# Patient Record
Sex: Female | Born: 1955 | Race: White | Hispanic: No | Marital: Married | Smoking: Former smoker
Health system: Southern US, Community
[De-identification: ages and names within clinical notes are randomized; demographics above are authoritative.]

## PROBLEM LIST (undated history)

## (undated) DIAGNOSIS — G43909 Migraine, unspecified, not intractable, without status migrainosus: Secondary | ICD-10-CM

## (undated) DIAGNOSIS — R011 Cardiac murmur, unspecified: Secondary | ICD-10-CM

## (undated) DIAGNOSIS — T7840XA Allergy, unspecified, initial encounter: Secondary | ICD-10-CM

## (undated) HISTORY — DX: Migraine, unspecified, not intractable, without status migrainosus: G43.909

## (undated) HISTORY — DX: Cardiac murmur, unspecified: R01.1

## (undated) HISTORY — PX: FOOT FRACTURE SURGERY: SHX645

## (undated) HISTORY — DX: Allergy, unspecified, initial encounter: T78.40XA

---

## 2013-11-03 HISTORY — PX: FACIAL COSMETIC SURGERY: SHX629

## 2015-08-17 ENCOUNTER — Encounter: Payer: Self-pay | Admitting: Internal Medicine

## 2015-08-17 ENCOUNTER — Ambulatory Visit (INDEPENDENT_AMBULATORY_CARE_PROVIDER_SITE_OTHER): Payer: Managed Care, Other (non HMO) | Admitting: Internal Medicine

## 2015-08-17 VITALS — BP 136/86 | HR 70 | Temp 98.2°F | Resp 14 | Ht 67.0 in | Wt 136.0 lb

## 2015-08-17 DIAGNOSIS — Z1211 Encounter for screening for malignant neoplasm of colon: Secondary | ICD-10-CM

## 2015-08-17 DIAGNOSIS — Z0000 Encounter for general adult medical examination without abnormal findings: Secondary | ICD-10-CM | POA: Diagnosis not present

## 2015-08-17 MED ORDER — NYSTATIN-TRIAMCINOLONE 100000-0.1 UNIT/GM-% EX CREA: 1.0000 "application " | TOPICAL_CREAM | Freq: Two times a day (BID) | CUTANEOUS | Status: DC

## 2015-08-17 MED ORDER — ESCITALOPRAM OXALATE 10 MG PO TABS: 10.0000 mg | ORAL_TABLET | Freq: Every day | ORAL | Status: DC

## 2015-08-17 NOTE — Progress Notes (Signed)
Pre visit review using our clinic review tool, if applicable. No additional management support is needed unless otherwise documented below in the visit note. 

## 2015-08-17 NOTE — Patient Instructions (Signed)
We have sent in lexapro (escitalopram) which is an atypical antidepressant to help with the hot flashes. It can take up to 2 weeks to take full effect with the hot flashes. If you do not notice benefit call the office and we can explore other options to get you feeling better.   We will check the labs today and call you back with the results. We will get you in with the GI doctor to do the colon cancer screening.   We have also sent in a cream for the rash that you can use twice a day until the rash is gone then you can use as needed if the rash returns.   Health Maintenance, Female Adopting a healthy lifestyle and getting preventive care can go a long way to promote health and wellness. Talk with your health care provider about what schedule of regular examinations is right for you. This is a good chance for you to check in with your provider about disease prevention and staying healthy. In between checkups, there are plenty of things you can do on your own. Experts have done a lot of research about which lifestyle changes and preventive measures are most likely to keep you healthy. Ask your health care provider for more information. WEIGHT AND DIET  Eat a healthy diet  Be sure to include plenty of vegetables, fruits, low-fat dairy products, and lean protein.  Do not eat a lot of foods high in solid fats, added sugars, or salt.  Get regular exercise. This is one of the most important things you can do for your health.  Most adults should exercise for at least 150 minutes each week. The exercise should increase your heart rate and make you sweat (moderate-intensity exercise).  Most adults should also do strengthening exercises at least twice a week. This is in addition to the moderate-intensity exercise.  Maintain a healthy weight  Body mass index (BMI) is a measurement that can be used to identify possible weight problems. It estimates body fat based on height and weight. Your health care  provider can help determine your BMI and help you achieve or maintain a healthy weight.  For females 47 years of age and older:   A BMI below 18.5 is considered underweight.  A BMI of 18.5 to 24.9 is normal.  A BMI of 25 to 29.9 is considered overweight.  A BMI of 30 and above is considered obese.  Watch levels of cholesterol and blood lipids  You should start having your blood tested for lipids and cholesterol at 58 years of age, then have this test every 5 years.  You may need to have your cholesterol levels checked more often if:  Your lipid or cholesterol levels are high.  You are older than 58 years of age.  You are at high risk for heart disease.  CANCER SCREENING   Lung Cancer  Lung cancer screening is recommended for adults 80-53 years old who are at high risk for lung cancer because of a history of smoking.  A yearly low-dose CT scan of the lungs is recommended for people who:  Currently smoke.  Have quit within the past 15 years.  Have at least a 30-pack-year history of smoking. A pack year is smoking an average of one pack of cigarettes a day for 1 year.  Yearly screening should continue until it has been 15 years since you quit.  Yearly screening should stop if you develop a health problem that would prevent you  from having lung cancer treatment.  Breast Cancer  Practice breast self-awareness. This means understanding how your breasts normally appear and feel.  It also means doing regular breast self-exams. Let your health care provider know about any changes, no matter how small.  If you are in your 20s or 30s, you should have a clinical breast exam (CBE) by a health care provider every 1-3 years as part of a regular health exam.  If you are 13 or older, have a CBE every year. Also consider having a breast X-ray (mammogram) every year.  If you have a family history of breast cancer, talk to your health care provider about genetic screening.  If you  are at high risk for breast cancer, talk to your health care provider about having an MRI and a mammogram every year.  Breast cancer gene (BRCA) assessment is recommended for women who have family members with BRCA-related cancers. BRCA-related cancers include:  Breast.  Ovarian.  Tubal.  Peritoneal cancers.  Results of the assessment will determine the need for genetic counseling and BRCA1 and BRCA2 testing. Cervical Cancer Your health care provider may recommend that you be screened regularly for cancer of the pelvic organs (ovaries, uterus, and vagina). This screening involves a pelvic examination, including checking for microscopic changes to the surface of your cervix (Pap test). You may be encouraged to have this screening done every 3 years, beginning at age 33.  For women ages 94-65, health care providers may recommend pelvic exams and Pap testing every 3 years, or they may recommend the Pap and pelvic exam, combined with testing for human papilloma virus (HPV), every 5 years. Some types of HPV increase your risk of cervical cancer. Testing for HPV may also be done on women of any age with unclear Pap test results.  Other health care providers may not recommend any screening for nonpregnant women who are considered low risk for pelvic cancer and who do not have symptoms. Ask your health care provider if a screening pelvic exam is right for you.  If you have had past treatment for cervical cancer or a condition that could lead to cancer, you need Pap tests and screening for cancer for at least 20 years after your treatment. If Pap tests have been discontinued, your risk factors (such as having a new sexual partner) need to be reassessed to determine if screening should resume. Some women have medical problems that increase the chance of getting cervical cancer. In these cases, your health care provider may recommend more frequent screening and Pap tests. Colorectal Cancer  This type of  cancer can be detected and often prevented.  Routine colorectal cancer screening usually begins at 58 years of age and continues through 58 years of age.  Your health care provider may recommend screening at an earlier age if you have risk factors for colon cancer.  Your health care provider may also recommend using home test kits to check for hidden blood in the stool.  A small camera at the end of a tube can be used to examine your colon directly (sigmoidoscopy or colonoscopy). This is done to check for the earliest forms of colorectal cancer.  Routine screening usually begins at age 17.  Direct examination of the colon should be repeated every 5-10 years through 58 years of age. However, you may need to be screened more often if early forms of precancerous polyps or small growths are found. Skin Cancer  Check your skin from head to toe  regularly.  Tell your health care provider about any new moles or changes in moles, especially if there is a change in a mole's shape or color.  Also tell your health care provider if you have a mole that is larger than the size of a pencil eraser.  Always use sunscreen. Apply sunscreen liberally and repeatedly throughout the day.  Protect yourself by wearing long sleeves, pants, a wide-brimmed hat, and sunglasses whenever you are outside. HEART DISEASE, DIABETES, AND HIGH BLOOD PRESSURE   High blood pressure causes heart disease and increases the risk of stroke. High blood pressure is more likely to develop in:  People who have blood pressure in the high end of the normal range (130-139/85-89 mm Hg).  People who are overweight or obese.  People who are African American.  If you are 104-42 years of age, have your blood pressure checked every 3-5 years. If you are 84 years of age or older, have your blood pressure checked every year. You should have your blood pressure measured twice--once when you are at a hospital or clinic, and once when you are  not at a hospital or clinic. Record the average of the two measurements. To check your blood pressure when you are not at a hospital or clinic, you can use:  An automated blood pressure machine at a pharmacy.  A home blood pressure monitor.  If you are between 82 years and 41 years old, ask your health care provider if you should take aspirin to prevent strokes.  Have regular diabetes screenings. This involves taking a blood sample to check your fasting blood sugar level.  If you are at a normal weight and have a low risk for diabetes, have this test once every three years after 58 years of age.  If you are overweight and have a high risk for diabetes, consider being tested at a younger age or more often. PREVENTING INFECTION  Hepatitis B  If you have a higher risk for hepatitis B, you should be screened for this virus. You are considered at high risk for hepatitis B if:  You were born in a country where hepatitis B is common. Ask your health care provider which countries are considered high risk.  Your parents were born in a high-risk country, and you have not been immunized against hepatitis B (hepatitis B vaccine).  You have HIV or AIDS.  You use needles to inject street drugs.  You live with someone who has hepatitis B.  You have had sex with someone who has hepatitis B.  You get hemodialysis treatment.  You take certain medicines for conditions, including cancer, organ transplantation, and autoimmune conditions. Hepatitis C  Blood testing is recommended for:  Everyone born from [birth year] through 1964.  Anyone with known risk factors for hepatitis C. Sexually transmitted infections (STIs)  You should be screened for sexually transmitted infections (STIs) including gonorrhea and chlamydia if:  You are sexually active and are younger than 58 years of age.  You are older than 58 years of age and your health care provider tells you that you are at risk for this type of  infection.  Your sexual activity has changed since you were last screened and you are at an increased risk for chlamydia or gonorrhea. Ask your health care provider if you are at risk.  If you do not have HIV, but are at risk, it may be recommended that you take a prescription medicine daily to prevent HIV infection. This is  called pre-exposure prophylaxis (PrEP). You are considered at risk if:  You are sexually active and do not regularly use condoms or know the HIV status of your partner(s).  You take drugs by injection.  You are sexually active with a partner who has HIV. Talk with your health care provider about whether you are at high risk of being infected with HIV. If you choose to begin PrEP, you should first be tested for HIV. You should then be tested every 3 months for as long as you are taking PrEP.  PREGNANCY   If you are premenopausal and you may become pregnant, ask your health care provider about preconception counseling.  If you may become pregnant, take 400 to 800 micrograms (mcg) of folic acid every day.  If you want to prevent pregnancy, talk to your health care provider about birth control (contraception). OSTEOPOROSIS AND MENOPAUSE   Osteoporosis is a disease in which the bones lose minerals and strength with aging. This can result in serious bone fractures. Your risk for osteoporosis can be identified using a bone density scan.  If you are 13 years of age or older, or if you are at risk for osteoporosis and fractures, ask your health care provider if you should be screened.  Ask your health care provider whether you should take a calcium or vitamin D supplement to lower your risk for osteoporosis.  Menopause may have certain physical symptoms and risks.  Hormone replacement therapy may reduce some of these symptoms and risks. Talk to your health care provider about whether hormone replacement therapy is right for you.  HOME CARE INSTRUCTIONS   Schedule regular  health, dental, and eye exams.  Stay current with your immunizations.   Do not use any tobacco products including cigarettes, chewing tobacco, or electronic cigarettes.  If you are pregnant, do not drink alcohol.  If you are breastfeeding, limit how much and how often you drink alcohol.  Limit alcohol intake to no more than 1 drink per day for nonpregnant women. One drink equals 12 ounces of beer, 5 ounces of wine, or 1 ounces of hard liquor.  Do not use street drugs.  Do not share needles.  Ask your health care provider for help if you need support or information about quitting drugs.  Tell your health care provider if you often feel depressed.  Tell your health care provider if you have ever been abused or do not feel safe at home.   This information is not intended to replace advice given to you by your health care provider. Make sure you discuss any questions you have with your health care provider.   Document Released: 05/05/2011 Document Revised: 11/10/2014 Document Reviewed: 09/21/2013 Elsevier Interactive Patient Education [employer].

## 2015-08-19 DIAGNOSIS — Z0000 Encounter for general adult medical examination without abnormal findings: Secondary | ICD-10-CM | POA: Insufficient documentation

## 2015-08-19 NOTE — Progress Notes (Signed)
   Subjective:    Patient ID: Shirley Wilson, female    DOB: 12/04/1955, 59 y.o.   MRN: [id number]  HPI The patient is a 59 YO female coming in new for wellness. She is having some mild hot flashes and would like to try non-hormonal therapy for that. No other concerns.   [facility], social history reviewed and updated.   Review of Systems  Constitutional: Negative for fever, activity change, appetite change, fatigue and unexpected weight change.  HENT: Negative.   Eyes: Negative.   Respiratory: Negative for cough, chest tightness, shortness of breath and wheezing.   Cardiovascular: Negative for chest pain, palpitations and leg swelling.  Gastrointestinal: Negative for nausea, abdominal pain, diarrhea, constipation and abdominal distention.  Musculoskeletal: Negative.   Skin: Negative.   Neurological: Negative.   Psychiatric/Behavioral: Negative.       Objective:   Physical Exam  Constitutional: She is oriented to person, place, and time. She appears well-developed and well-nourished.  HENT:  Head: Normocephalic and atraumatic.  Eyes: EOM are normal.  Neck: Normal range of motion.  Cardiovascular: Normal rate and regular rhythm.   Pulmonary/Chest: Effort normal and breath sounds normal. No respiratory distress. She has no wheezes. She has no rales.  Abdominal: Soft. Bowel sounds are normal. She exhibits no distension. There is no tenderness. There is no rebound.  Musculoskeletal: She exhibits no edema.  Neurological: She is alert and oriented to person, place, and time. Coordination normal.  Skin: Skin is warm and dry.  Psychiatric: She has a normal mood and affect.   Filed Vitals:   08/17/15 1548  BP: 136/86  Pulse: 70  Temp: 98.2 F (36.8 C)  Resp: 14  Height: 5\' 7"  (1.702 m)  Weight: 136 lb (61.689 kg)  SpO2: 99%      Assessment & Plan:

## 2015-08-19 NOTE — Assessment & Plan Note (Addendum)
Rx for lexapro for symptomatic hot flashes. Referral to GI for colonoscopy (has never had one). Checking labs, adjust as needed. Non-smoker and exercises regularly. Declines flu shot and will get records for other immunizations.

## 2015-08-20 ENCOUNTER — Encounter: Payer: Self-pay | Admitting: Gastroenterology

## 2015-08-21 ENCOUNTER — Ambulatory Visit (AMBULATORY_SURGERY_CENTER): Payer: Self-pay | Admitting: *Deleted

## 2015-08-21 VITALS — Ht 67.0 in | Wt 136.6 lb

## 2015-08-21 DIAGNOSIS — Z1211 Encounter for screening for malignant neoplasm of colon: Secondary | ICD-10-CM

## 2015-08-21 MED ORDER — NA SULFATE-K SULFATE-MG SULF 17.5-3.13-1.6 GM/177ML PO SOLN: ORAL | Status: DC

## 2015-08-21 NOTE — Progress Notes (Signed)
No egg or soy allergy  No anesthesia or intubation problems per pt  No diet medications taken   

## 2015-08-23 ENCOUNTER — Telehealth: Payer: Self-pay | Admitting: *Deleted

## 2015-08-23 MED ORDER — CITALOPRAM HYDROBROMIDE 20 MG PO TABS: 20.0000 mg | ORAL_TABLET | Freq: Every day | ORAL | Status: DC

## 2015-08-23 NOTE — Telephone Encounter (Signed)
Left msg on triage stating she started on the Lexapro last Friday. She had to stop med on yesterday can't tolerate the symptoms. Med make her nauseated, dizzy, and have headaches. Requesting something else...Raechel Chute/lmb

## 2015-08-23 NOTE — Telephone Encounter (Signed)
Notified pt with md response. Rx sent to pharmacy.../lmb 

## 2015-08-23 NOTE — Telephone Encounter (Signed)
Have sent in the alternative citalopram. Take 1 pill daily and should see results in 2-3 weeks.

## 2015-08-24 ENCOUNTER — Other Ambulatory Visit (INDEPENDENT_AMBULATORY_CARE_PROVIDER_SITE_OTHER): Payer: Managed Care, Other (non HMO)

## 2015-08-24 DIAGNOSIS — Z0000 Encounter for general adult medical examination without abnormal findings: Secondary | ICD-10-CM

## 2015-08-24 LAB — LIPID PANEL
CHOL/HDL RATIO: 3
Cholesterol: 277 mg/dL — ABNORMAL HIGH (ref 0–200)
HDL: 84.2 mg/dL (ref >39.00)
LDL CALC: 164 mg/dL — AB (ref 0–99)
NONHDL: 192.48
Triglycerides: 141 mg/dL (ref 0.0–149.0)
VLDL: 28.2 mg/dL (ref 0.0–40.0)

## 2015-08-24 LAB — COMPREHENSIVE METABOLIC PANEL
ALK PHOS: 72 U/L (ref 39–117)
ALT: 28 U/L (ref 0–35)
AST: 25 U/L (ref 0–37)
Albumin: 4.8 g/dL (ref 3.5–5.2)
BUN: 11 mg/dL (ref 6–23)
CHLORIDE: 101 meq/L (ref 96–112)
CO2: 28 meq/L (ref 19–32)
Calcium: 10.3 mg/dL (ref 8.4–10.5)
Creatinine, Ser: 0.73 mg/dL (ref 0.40–1.20)
GFR: 87.04 mL/min (ref >60.00)
GLUCOSE: 89 mg/dL (ref 70–99)
POTASSIUM: 4.1 meq/L (ref 3.5–5.1)
SODIUM: 138 meq/L (ref 135–145)
TOTAL PROTEIN: 7.6 g/dL (ref 6.0–8.3)
Total Bilirubin: 0.5 mg/dL (ref 0.2–1.2)

## 2015-08-24 LAB — CBC
HCT: 47.2 % — ABNORMAL HIGH (ref 36.0–46.0)
HEMOGLOBIN: 15.5 g/dL — AB (ref 12.0–15.0)
MCHC: 32.8 g/dL (ref 30.0–36.0)
MCV: 90.4 fl (ref 78.0–100.0)
Platelets: 314 10*3/uL (ref 150.0–400.0)
RBC: 5.23 Mil/uL — ABNORMAL HIGH (ref 3.87–5.11)
RDW: 13.1 % (ref 11.5–15.5)
WBC: 5.2 10*3/uL (ref 4.0–10.5)

## 2015-08-31 ENCOUNTER — Encounter: Payer: Self-pay | Admitting: Gastroenterology

## 2015-08-31 ENCOUNTER — Ambulatory Visit (AMBULATORY_SURGERY_CENTER): Payer: Managed Care, Other (non HMO) | Admitting: Gastroenterology

## 2015-08-31 VITALS — BP 122/77 | HR 57 | Temp 98.1°F | Resp 20 | Ht 67.0 in | Wt 136.6 lb

## 2015-08-31 DIAGNOSIS — Z1211 Encounter for screening for malignant neoplasm of colon: Secondary | ICD-10-CM

## 2015-08-31 MED ORDER — SODIUM CHLORIDE 0.9 % IV SOLN: 500.0000 mL | INTRAVENOUS | Status: DC

## 2015-08-31 NOTE — Progress Notes (Signed)
Report to PACU, RN, vss, BBS= Clear.  

## 2015-08-31 NOTE — Op Note (Signed)
[facility] [address] [place], [id number]   COLONOSCOPY PROCEDURE REPORT  PATIENT: Shirley RogerLeveque, Camille  MR#: [id number] BIRTHDATE: 09-Aug-1956 , 58  yrs. old GENDER: female ENDOSCOPIST: Rachael Feeaniel P Jacobs, MD REFERRED BY: Hillard DankerElizabeth Crawford, MD PROCEDURE DATE:  08/31/2015 PROCEDURE:   Colonoscopy, screening First Screening Colonoscopy - Avg.  risk and is 50 yrs.  old or older Yes.  Prior Negative Screening - Now for repeat screening. N/A  History of Adenoma - Now for follow-up colonoscopy & has been > or = to 3 yrs.  N/A  Recommend repeat exam, <10 yrs? No ASA CLASS:   Class II INDICATIONS:Screening for colonic neoplasia and Colorectal Neoplasm Risk Assessment for this procedure is average risk. MEDICATIONS: Monitored anesthesia care and Propofol 230 mg IV  DESCRIPTION OF PROCEDURE:   After the risks benefits and alternatives of the procedure were thoroughly explained, informed consent was obtained.  The digital rectal exam revealed no abnormalities of the rectum.   The LB [id number]-[id number]  endoscope was introduced through the anus and advanced to the cecum, which was identified by both the appendix and ileocecal valve. No adverse events experienced.   The quality of the prep was excellent.  The instrument was then slowly withdrawn as the colon was fully examined. Estimated blood loss is zero unless otherwise noted in this procedure report.   COLON FINDINGS: There was very minor 'barotrauma' in the cecum, proximal ascending segment.  The examination was otherwise normal. Retroflexed views revealed no abnormalities. The time to cecum = 4.2 Withdrawal time = 5.7   The scope was withdrawn and the procedure completed. COMPLICATIONS: There were no immediate complications.  ENDOSCOPIC IMPRESSION: There was very minor 'barotrauma' in the cecum, proximal ascending segment.  The examination was otherwise normal  RECOMMENDATIONS: You should continue to follow colorectal  cancer screening guidelines for "routine risk" patients with a repeat colonoscopy in 10 years.   eSigned:  Rachael Feeaniel P Jacobs, MD 08/31/2015 9:26 AM

## 2015-08-31 NOTE — Patient Instructions (Signed)
YOU HAD AN ENDOSCOPIC PROCEDURE TODAY AT THE [facility]:   Refer to the procedure report that was given to you for any specific questions about what was found during the examination.  If the procedure report does not answer your questions, please call your gastroenterologist to clarify.  If you requested that your care partner not be given the details of your procedure findings, then the procedure report has been included in a sealed envelope for you to review at your convenience later.  YOU SHOULD EXPECT: Some feelings of bloating in the abdomen. Passage of more gas than usual.  Walking can help get rid of the air that was put into your GI tract during the procedure and reduce the bloating. If you had a lower endoscopy (such as a colonoscopy or flexible sigmoidoscopy) you may notice spotting of blood in your stool or on the toilet paper. If you underwent a bowel prep for your procedure, you may not have a normal bowel movement for a few days.  Please Note:  You might notice some irritation and congestion in your nose or some drainage.  This is from the oxygen used during your procedure.  There is no need for concern and it should clear up in a day or so.  SYMPTOMS TO REPORT IMMEDIATELY:   Following lower endoscopy (colonoscopy or flexible sigmoidoscopy):  Excessive amounts of blood in the stool  Significant tenderness or worsening of abdominal pains  Swelling of the abdomen that is new, acute  Fever of 100F or higher   For urgent or emergent issues, a gastroenterologist can be reached at any hour by calling [phone].   DIET: Your first meal following the procedure should be a small meal and then it is ok to progress to your normal diet. Heavy or fried foods are harder to digest and may make you feel nauseous or bloated.  Likewise, meals heavy in dairy and vegetables can increase bloating.  Drink plenty of fluids but you should avoid alcoholic beverages for 24  hours.  ACTIVITY:  You should plan to take it easy for the rest of today and you should NOT DRIVE or use heavy machinery until tomorrow (because of the sedation medicines used during the test).    FOLLOW UP: Our staff will call the number listed on your records the next business day following your procedure to check on you and address any questions or concerns that you may have regarding the information given to you following your procedure. If we do not reach you, we will leave a message.  However, if you are feeling well and you are not experiencing any problems, there is no need to return our call.  We will assume that you have returned to your regular daily activities without incident.  If any biopsies were taken you will be contacted by phone or by letter within the next 1-3 weeks.  Please call us at [phone] if you have not heard about the biopsies in 3 weeks.    SIGNATURES/CONFIDENTIALITY: You and/or your care partner have signed paperwork which will be entered into your electronic medical record.  These signatures attest to the fact that that the information above on your After Visit Summary has been reviewed and is understood.  Full responsibility of the confidentiality of this discharge information lies with you and/or your care-partner. 

## 2015-09-03 ENCOUNTER — Telehealth: Payer: Self-pay | Admitting: *Deleted

## 2015-09-03 NOTE — Telephone Encounter (Signed)
Left msg on triage stating wanting to see will md rx something for motion sickness prefer phenergan. [place]...Raechel Chute/lmb

## 2015-09-03 NOTE — Telephone Encounter (Signed)
  Follow up Call-  Call back number 08/31/2015  Post procedure Call Back phone  # [phone]  Permission to leave phone message Yes     Patient questions:  Message left to call us if necessary.

## 2015-09-03 NOTE — Telephone Encounter (Signed)
Both benadryl and meclizine over the counter are good agents for motion sickness. These tend to be better as they are less sedating than phenergan.

## 2015-09-03 NOTE — Telephone Encounter (Signed)
Called pt no answer LMOM with md response.../lmb 

## 2015-09-06 ENCOUNTER — Ambulatory Visit (INDEPENDENT_AMBULATORY_CARE_PROVIDER_SITE_OTHER): Payer: Managed Care, Other (non HMO) | Admitting: Family

## 2015-09-06 ENCOUNTER — Encounter: Payer: Self-pay | Admitting: Family

## 2015-09-06 VITALS — BP 132/84 | HR 55 | Temp 98.0°F | Resp 18 | Ht 67.0 in | Wt 137.0 lb

## 2015-09-06 DIAGNOSIS — T753XXD Motion sickness, subsequent encounter: Secondary | ICD-10-CM | POA: Diagnosis not present

## 2015-09-06 DIAGNOSIS — J0100 Acute maxillary sinusitis, unspecified: Secondary | ICD-10-CM

## 2015-09-06 DIAGNOSIS — T753XXA Motion sickness, initial encounter: Secondary | ICD-10-CM | POA: Insufficient documentation

## 2015-09-06 DIAGNOSIS — J019 Acute sinusitis, unspecified: Secondary | ICD-10-CM | POA: Insufficient documentation

## 2015-09-06 MED ORDER — PROMETHAZINE HCL 12.5 MG PO TABS: 12.5000 mg | ORAL_TABLET | Freq: Three times a day (TID) | ORAL | Status: DC | PRN

## 2015-09-06 MED ORDER — PREDNISONE 20 MG PO TABS: 20.0000 mg | ORAL_TABLET | Freq: Two times a day (BID) | ORAL | Status: DC

## 2015-09-06 MED ORDER — AMOXICILLIN-POT CLAVULANATE 875-125 MG PO TABS: 1.0000 | ORAL_TABLET | Freq: Two times a day (BID) | ORAL | Status: DC

## 2015-09-06 NOTE — Progress Notes (Signed)
Subjective:    Patient ID: Shirley Wilson, female    DOB: [birth year], 59 y.o.   MRN: [id number]  Chief Complaint  Patient presents with  . Sinus pressure    x2 days, runny, nose, sinus headache and pressure    HPI:  Shirley Wilson is a 59 y.o. female who  has a past medical history of Allergy; Migraine; and Heart murmur. and presents today for an acute office visit.  1.) Sinus pressure - This is a new problem. Associated symptoms of congestion, sinus headache, and sinus pressure been going on for approximately 3 days. Modifying factors include an OTC allergy/decongestant and afrin which have not helped with her symptoms. Symptoms have improved a little but the amount of discomfort is effecting her lifestyle. Severity is enough to keep her up at night. Denies any recent antibiotic use.   2.) Motion sickness - previously noted to have motion sickness when riding cars and is planning a trip to the mountains. Indicates scopolamine has helped for seasickness benign cars. Requesting a refill of Phenergan which she had taken previously which helps with her nausea during car rides.  Allergies  Allergen Reactions  . Sulfa Antibiotics     Rash all over     Current Outpatient Prescriptions on File Prior to Visit  Medication Sig Dispense Refill  . citalopram (CELEXA) 20 MG tablet Take 1 tablet (20 mg total) by mouth daily. 30 tablet 3   No current facility-administered medications on file prior to visit.     Past Surgical History  Procedure Laterality Date  . Foot fracture surgery Right   . Facial cosmetic surgery  2015      Review of Systems  Constitutional: Negative for fever and chills.  HENT: Positive for congestion, ear pain and sinus pressure. Negative for sore throat.   Respiratory: Positive for cough. Negative for chest tightness and shortness of breath.   Neurological: Positive for headaches.      Objective:    BP 132/84 mmHg  Pulse 55  Temp(Src) 98 F (36.7 C) (Oral)   Resp 18  Ht  (1.702 m)  Wt 137 lb (62.143 kg)  BMI 21.45 kg/m2  SpO2 98%  LMP  (LMP Unknown) Nursing note and vital signs reviewed.  Physical Exam  Constitutional: She is oriented to person, place, and time. She appears well-developed and well-nourished. No distress.  HENT:  Right Ear: Hearing, tympanic membrane, external ear and ear canal normal.  Left Ear: Hearing, tympanic membrane, external ear and ear canal normal.  Nose: Right sinus exhibits maxillary sinus tenderness and frontal sinus tenderness. Left sinus exhibits maxillary sinus tenderness and frontal sinus tenderness.  Mouth/Throat: Uvula is midline, oropharynx is clear and moist and mucous membranes are normal.  Neck: Neck supple.  Cardiovascular: Normal rate, regular rhythm, normal heart sounds and intact distal pulses.   Pulmonary/Chest: Effort normal and breath sounds normal.  Neurological: She is alert and oriented to person, place, and time.  Skin: Skin is warm and dry.  Psychiatric: She has a normal mood and affect. Her behavior is normal. Judgment and thought content normal.       Assessment & Plan:   Problem List Items Addressed This Visit      Respiratory   Sinusitis, acute - Primary    Symptoms and exam consistent with sinusitis. Start Augmentin. Start prednisone for allergy control. Continue over-the-counter medications as needed for symptom relief and supportive care. Follow-up if symptoms worsen or fail to improve.  Relevant Medications   amoxicillin-clavulanate (AUGMENTIN) 875-125 MG tablet   predniSONE (DELTASONE) 20 MG tablet   promethazine (PHENERGAN) 12.5 MG tablet     Other   Motion sickness    History of motion sickness improved with Phenergan. Refill Phenergan per patient request. Follow-up if symptoms worsen or do not improve with medication regimen.      Relevant Medications   promethazine (PHENERGAN) 12.5 MG tablet

## 2015-09-06 NOTE — Assessment & Plan Note (Signed)
History of motion sickness improved with Phenergan. Refill Phenergan per patient request. Follow-up if symptoms worsen or do not improve with medication regimen.

## 2015-09-06 NOTE — Assessment & Plan Note (Signed)
Symptoms and exam consistent with sinusitis. Start Augmentin. Start prednisone for allergy control. Continue over-the-counter medications as needed for symptom relief and supportive care. Follow-up if symptoms worsen or fail to improve.

## 2015-09-06 NOTE — Patient Instructions (Signed)
Thank you for choosing [facility].  Summary/Instructions:  Your prescription(s) have been submitted to your pharmacy or been printed and provided for you. Please take as directed and contact our office if you believe you are having problem(s) with the medication(s) or have any questions.  If your symptoms worsen or fail to improve, please contact our office for further instruction, or in case of emergency go directly to the emergency room at the closest medical facility.   General Recommendations:    Please drink plenty of fluids.  Get plenty of rest   Sleep in humidified air  Use saline nasal sprays  Netti pot   OTC Medications:  Decongestants - helps relieve congestion   Flonase (generic fluticasone) or Nasacort (generic triamcinolone) - please make sure to use the "cross-over" technique at a 45 degree angle towards the opposite eye as opposed to straight up the nasal passageway.   Sudafed (generic pseudoephedrine - Note this is the one that is available behind the pharmacy counter); Products with phenylephrine (-PE) may also be used but is often not as effective as pseudoephedrine.   If you have HIGH BLOOD PRESSURE - Coricidin HBP; AVOID any product that is -D as this contains pseudoephedrine which may increase your blood pressure.  Afrin (oxymetazoline) every 6-8 hours for up to 3 days.   Allergies - helps relieve runny nose, itchy eyes and sneezing   Claritin (generic loratidine), Allegra (fexofenidine), or Zyrtec (generic cyrterizine) for runny nose. These medications should not cause drowsiness.  Note - Benadryl (generic diphenhydramine) may be used however may cause drowsiness  Cough -   Delsym or Robitussin (generic dextromethorphan)  Expectorants - helps loosen mucus to ease removal   Mucinex (generic guaifenesin) as directed on the package.  Headaches / General Aches   Tylenol (generic acetaminophen) - DO NOT EXCEED 3 grams (3,000 mg) in a 24  hour time period  Advil/Motrin (generic ibuprofen)   Sore Throat -   Salt water gargle   Chloraseptic (generic benzocaine) spray or lozenges / Sucrets (generic dyclonine)    Sinusitis, Adult Sinusitis is redness, soreness, and inflammation of the paranasal sinuses. Paranasal sinuses are air pockets within the bones of your face. They are located beneath your eyes, in the middle of your forehead, and above your eyes. In healthy paranasal sinuses, mucus is able to drain out, and air is able to circulate through them by way of your nose. However, when your paranasal sinuses are inflamed, mucus and air can become trapped. This can allow bacteria and other germs to grow and cause infection. Sinusitis can develop quickly and last only a short time (acute) or continue over a long period (chronic). Sinusitis that lasts for more than 12 weeks is considered chronic. CAUSES Causes of sinusitis include:  Allergies.  Structural abnormalities, such as displacement of the cartilage that separates your nostrils (deviated septum), which can decrease the air flow through your nose and sinuses and affect sinus drainage.  Functional abnormalities, such as when the small hairs (cilia) that line your sinuses and help remove mucus do not work properly or are not present. SIGNS AND SYMPTOMS Symptoms of acute and chronic sinusitis are the same. The primary symptoms are pain and pressure around the affected sinuses. Other symptoms include:  Upper toothache.  Earache.  Headache.  Bad breath.  Decreased sense of smell and taste.  A cough, which worsens when you are lying flat.  Fatigue.  Fever.  Thick drainage from your nose, which often is green   and may contain pus (purulent).  Swelling and warmth over the affected sinuses. DIAGNOSIS Your health care provider will perform a physical exam. During your exam, your health care provider may perform any of the following to help determine if you have  acute sinusitis or chronic sinusitis:  Look in your nose for signs of abnormal growths in your nostrils (nasal polyps).  Tap over the affected sinus to check for signs of infection.  View the inside of your sinuses using an imaging device that has a light attached (endoscope). If your health care provider suspects that you have chronic sinusitis, one or more of the following tests may be recommended:  Allergy tests.  Nasal culture. A sample of mucus is taken from your nose, sent to a lab, and screened for bacteria.  Nasal cytology. A sample of mucus is taken from your nose and examined by your health care provider to determine if your sinusitis is related to an allergy. TREATMENT Most cases of acute sinusitis are related to a viral infection and will resolve on their own within 10 days. Sometimes, medicines are prescribed to help relieve symptoms of both acute and chronic sinusitis. These may include pain medicines, decongestants, nasal steroid sprays, or saline sprays. However, for sinusitis related to a bacterial infection, your health care provider will prescribe antibiotic medicines. These are medicines that will help kill the bacteria causing the infection. Rarely, sinusitis is caused by a fungal infection. In these cases, your health care provider will prescribe antifungal medicine. For some cases of chronic sinusitis, surgery is needed. Generally, these are cases in which sinusitis recurs more than 3 times per year, despite other treatments. HOME CARE INSTRUCTIONS  Drink plenty of water. Water helps thin the mucus so your sinuses can drain more easily.  Use a humidifier.  Inhale steam 3-4 times a day (for example, sit in the bathroom with the shower running).  Apply a warm, moist washcloth to your face 3-4 times a day, or as directed by your health care provider.  Use saline nasal sprays to help moisten and clean your sinuses.  Take medicines only as directed by your health care  provider.  If you were prescribed either an antibiotic or antifungal medicine, finish it all even if you start to feel better. SEEK IMMEDIATE MEDICAL CARE IF:  You have increasing pain or severe headaches.  You have nausea, vomiting, or drowsiness.  You have swelling around your face.  You have vision problems.  You have a stiff neck.  You have difficulty breathing.   This information is not intended to replace advice given to you by your health care provider. Make sure you discuss any questions you have with your health care provider.   Document Released: 10/20/2005 Document Revised: 11/10/2014 Document Reviewed: 11/04/2011 Elsevier Interactive Patient Education 2016 [employer]  

## 2015-09-06 NOTE — Progress Notes (Signed)
Pre visit review using our clinic review tool, if applicable. No additional management support is needed unless otherwise documented below in the visit note. 

## 2016-01-14 ENCOUNTER — Ambulatory Visit (INDEPENDENT_AMBULATORY_CARE_PROVIDER_SITE_OTHER): Payer: Managed Care, Other (non HMO) | Admitting: Internal Medicine

## 2016-01-14 ENCOUNTER — Other Ambulatory Visit: Payer: Self-pay | Admitting: Internal Medicine

## 2016-01-14 ENCOUNTER — Encounter: Payer: Self-pay | Admitting: Internal Medicine

## 2016-01-14 VITALS — BP 136/78 | HR 68 | Temp 98.4°F | Resp 12 | Ht 67.0 in | Wt 141.1 lb

## 2016-01-14 DIAGNOSIS — R232 Flushing: Secondary | ICD-10-CM

## 2016-01-14 DIAGNOSIS — Z1239 Encounter for other screening for malignant neoplasm of breast: Secondary | ICD-10-CM

## 2016-01-14 DIAGNOSIS — Z1231 Encounter for screening mammogram for malignant neoplasm of breast: Secondary | ICD-10-CM

## 2016-01-14 DIAGNOSIS — N951 Menopausal and female climacteric states: Secondary | ICD-10-CM

## 2016-01-14 MED ORDER — ESTRADIOL 0.5 MG PO TABS: 0.5000 mg | ORAL_TABLET | Freq: Every day | ORAL | Status: AC

## 2016-01-14 MED ORDER — PROGESTERONE MICRONIZED 200 MG PO CAPS: ORAL_CAPSULE | ORAL | Status: AC

## 2016-01-14 NOTE — Progress Notes (Signed)
Pre visit review using our clinic review tool, if applicable. No additional management support is needed unless otherwise documented below in the visit note. 

## 2016-01-14 NOTE — Progress Notes (Signed)
   Subjective:    Patient ID: Shirley Wilson, female    DOB: 02/12/1956, 60 y.o.   MRN: [id number]  HPI The patient is a 60 YO female coming in for some medication concerns. We had started lexapro for hot flashes but she had severe side effects so switched to celexa. She tried this for 2 months but was very sluggish. Hot flashes were somewhat better though. Has been on hormone replacement in the past. Tolerated that well. Has had colonoscopy since last visit and is glad that she did this for her health.   Review of Systems  Constitutional: Negative for fever, activity change, appetite change, fatigue and unexpected weight change.  Respiratory: Negative for cough, chest tightness, shortness of breath and wheezing.   Cardiovascular: Negative for chest pain, palpitations and leg swelling.  Gastrointestinal: Negative for nausea, abdominal pain, diarrhea, constipation and abdominal distention.  Endocrine: Positive for heat intolerance.  Musculoskeletal: Negative.   Skin: Negative.   Neurological: Negative.   Psychiatric/Behavioral: Negative.       Objective:   Physical Exam  Constitutional: She is oriented to person, place, and time. She appears well-developed and well-nourished.  HENT:  Head: Normocephalic and atraumatic.  Eyes: EOM are normal.  Neck: Normal range of motion.  Cardiovascular: Normal rate and regular rhythm.   Pulmonary/Chest: Effort normal and breath sounds normal. No respiratory distress. She has no wheezes. She has no rales.  Abdominal: Soft. Bowel sounds are normal. She exhibits no distension. There is no tenderness. There is no rebound.  Musculoskeletal: She exhibits no edema.  Neurological: She is alert and oriented to person, place, and time. Coordination normal.  Skin: Skin is warm and dry.  Psychiatric: She has a normal mood and affect.   Filed Vitals:   01/14/16 1519  BP: 136/78  Pulse: 68  Temp: 98.4 F (36.9 C)  TempSrc: Oral  Resp: 12  Height: 5\' 7"   (1.702 m)  Weight: 141 lb 1.9 oz (64.012 kg)  SpO2: 98%      Assessment & Plan:

## 2016-01-14 NOTE — Patient Instructions (Signed)
We have sent in the estrogen that you take daily. Since you still have your uterus you still need to be on progesterone as well for 12 days per month and we have sent that in as well.   We have ordered the mammogram and you can call and schedule at [phone]  Think about going to [facility] and they have pictures on the website of their providers.

## 2016-01-15 DIAGNOSIS — R232 Flushing: Secondary | ICD-10-CM | POA: Insufficient documentation

## 2016-01-15 NOTE — Assessment & Plan Note (Signed)
Rx for estrogen and progesterone (since she still has uterus) to help her get symptom free. She is also encouraged to get a gynecologist for her pap smears and she will do so.

## 2016-01-24 ENCOUNTER — Ambulatory Visit
Admission: RE | Admit: 2016-01-24 | Discharge: 2016-01-24 | Disposition: A | Discharge status: Home / Self Care | Payer: Managed Care, Other (non HMO) | Source: Ambulatory Visit | Attending: Internal Medicine | Admitting: Internal Medicine

## 2016-01-24 DIAGNOSIS — Z1231 Encounter for screening mammogram for malignant neoplasm of breast: Secondary | ICD-10-CM

## 2016-02-01 ENCOUNTER — Encounter: Payer: Self-pay | Admitting: Internal Medicine

## 2016-02-28 ENCOUNTER — Telehealth: Payer: Self-pay

## 2016-02-29 ENCOUNTER — Ambulatory Visit (INDEPENDENT_AMBULATORY_CARE_PROVIDER_SITE_OTHER)
Admission: RE | Admit: 2016-02-29 | Discharge: 2016-02-29 | Disposition: A | Discharge status: Home / Self Care | Payer: Managed Care, Other (non HMO) | Source: Ambulatory Visit | Attending: Internal Medicine | Admitting: Internal Medicine

## 2016-02-29 ENCOUNTER — Ambulatory Visit (INDEPENDENT_AMBULATORY_CARE_PROVIDER_SITE_OTHER): Payer: Managed Care, Other (non HMO) | Admitting: Internal Medicine

## 2016-02-29 ENCOUNTER — Encounter: Payer: Self-pay | Admitting: Internal Medicine

## 2016-02-29 VITALS — BP 130/70 | HR 67 | Temp 98.4°F | Wt 137.0 lb

## 2016-02-29 DIAGNOSIS — J189 Pneumonia, unspecified organism: Secondary | ICD-10-CM

## 2016-02-29 DIAGNOSIS — J209 Acute bronchitis, unspecified: Secondary | ICD-10-CM | POA: Insufficient documentation

## 2016-02-29 DIAGNOSIS — J4521 Mild intermittent asthma with (acute) exacerbation: Secondary | ICD-10-CM

## 2016-02-29 DIAGNOSIS — J00 Acute nasopharyngitis [common cold]: Secondary | ICD-10-CM | POA: Diagnosis not present

## 2016-02-29 DIAGNOSIS — J181 Lobar pneumonia, unspecified organism: Secondary | ICD-10-CM

## 2016-02-29 DIAGNOSIS — J45909 Unspecified asthma, uncomplicated: Secondary | ICD-10-CM | POA: Insufficient documentation

## 2016-02-29 MED ORDER — CEFDINIR 300 MG PO CAPS: 300.0000 mg | ORAL_CAPSULE | Freq: Two times a day (BID) | ORAL | Status: DC

## 2016-02-29 MED ORDER — METHYLPREDNISOLONE ACETATE 80 MG/ML IJ SUSP
80.0000 mg | Freq: Once | INTRAMUSCULAR | Status: AC
Start: 2016-02-29 — End: 2016-02-29
  Administered 2016-02-29: 80 mg via INTRAMUSCULAR

## 2016-02-29 MED ORDER — PROMETHAZINE-CODEINE 6.25-10 MG/5ML PO SYRP: 5.0000 mL | ORAL_SOLUTION | ORAL | Status: DC | PRN

## 2016-02-29 MED ORDER — UMECLIDINIUM-VILANTEROL 62.5-25 MCG/INH IN AEPB: 1.0000 | INHALATION_SPRAY | Freq: Every day | RESPIRATORY_TRACT | Status: DC

## 2016-02-29 MED ORDER — PSEUDOEPHEDRINE HCL ER 120 MG PO TB12: 120.0000 mg | ORAL_TABLET | Freq: Two times a day (BID) | ORAL | Status: DC | PRN

## 2016-02-29 NOTE — Patient Instructions (Signed)
Use over-the-counter  "cold" medicines  such as "Tylenol cold" , "Advil cold",  "Mucinex" or" Mucinex D"  for cough and congestion.   Avoid decongestants if you have high blood pressure and use "Afrin" nasal spray for nasal congestion as directed instead. Use" Delsym" or" Robitussin" cough syrup varietis for cough.  You can use plain "Tylenol" or "Advil" for fever, chills and achyness. Use Halls or Ricola cough drops.    

## 2016-02-29 NOTE — Progress Notes (Signed)
Subjective:  Patient ID: Shirley Wilson, female    DOB: 08/10/1956  Age: 60 y.o. MRN: [id number]  CC: No chief complaint on file.   HPI Shirley Wilson presents for URI since 4/8 - went to UC. UC on 4/18 - Prednisone, Levaquin, Albuterol C/o yellow phlegm - a lot of it .Marland Kitchen.Marland Kitchen.Can't sleep.Marland Kitchen.Marland Kitchen.Pt quit smoking 10 years ago  Outpatient Prescriptions Prior to Visit  Medication Sig Dispense Refill  . citalopram (CELEXA) 20 MG tablet Take 1 tablet (20 mg total) by mouth daily. 30 tablet 3  . estradiol (ESTRACE) 0.5 MG tablet Take 1 tablet (0.5 mg total) by mouth daily. 30 tablet 11  . progesterone (PROMETRIUM) 200 MG capsule Take 12 days per month 12 capsule 11   No facility-administered medications prior to visit.    ROS Review of Systems  Constitutional: Positive for fatigue. Negative for chills, activity change, appetite change and unexpected weight change.  HENT: Negative for congestion, mouth sores and sinus pressure.   Eyes: Negative for visual disturbance.  Respiratory: Positive for cough, choking and chest tightness.   Cardiovascular: Negative for palpitations and leg swelling.  Gastrointestinal: Negative for nausea and abdominal pain.  Genitourinary: Negative for frequency, difficulty urinating and vaginal pain.  Musculoskeletal: Negative for back pain, arthralgias and gait problem.  Skin: Negative for pallor and rash.  Neurological: Negative for dizziness, tremors, weakness, numbness and headaches.  Psychiatric/Behavioral: Positive for sleep disturbance. Negative for suicidal ideas and confusion.    Objective:  BP 130/70 mmHg  Pulse 67  Temp(Src) 98.4 F (36.9 C) (Oral)  Wt 137 lb (62.143 kg)  SpO2 96%  LMP  (LMP Unknown)  BP Readings from Last 3 Encounters:  02/29/16 130/70  01/14/16 136/78  09/06/15 132/84    Wt Readings from Last 3 Encounters:  02/29/16 137 lb (62.143 kg)  01/14/16 141 lb 1.9 oz (64.012 kg)  09/06/15 137 lb (62.143 kg)    Physical Exam    Constitutional: She appears well-developed. No distress.  HENT:  Head: Normocephalic.  Right Ear: External ear normal.  Left Ear: External ear normal.  Nose: Nose normal.  Mouth/Throat: Oropharynx is clear and moist.  Eyes: Conjunctivae are normal. Pupils are equal, round, and reactive to light. Right eye exhibits no discharge. Left eye exhibits no discharge.  Neck: Normal range of motion. Neck supple. No JVD present. No tracheal deviation present. No thyromegaly present.  Cardiovascular: Normal rate, regular rhythm and normal heart sounds.   Pulmonary/Chest: No stridor. No respiratory distress. She has no wheezes.  Abdominal: Soft. Bowel sounds are normal. She exhibits no distension and no mass. There is no tenderness. There is no rebound and no guarding.  Musculoskeletal: She exhibits no edema or tenderness.  Lymphadenopathy:    She has no cervical adenopathy.  Neurological: She displays normal reflexes. No cranial nerve deficit. She exhibits normal muscle tone. Coordination normal.  Skin: No rash noted. No erythema.  Psychiatric: She has a normal mood and affect. Her behavior is normal. Judgment and thought content normal.  Looks tired  I personally provided Anoro inhaler use teaching. After the teaching patient was able to demonstrate it's use effectively. All questions were answered   Lab Results  Component Value Date   WBC 5.2 08/24/2015   HGB 15.5* 08/24/2015   HCT 47.2* 08/24/2015   PLT 314.0 08/24/2015   GLUCOSE 89 08/24/2015   CHOL 277* 08/24/2015   TRIG 141.0 08/24/2015   HDL 84.20 08/24/2015   LDLCALC 164* 08/24/2015   ALT 28 08/24/2015  AST 25 08/24/2015   NA 138 08/24/2015   K 4.1 08/24/2015   CL 101 08/24/2015   CREATININE 0.73 08/24/2015   BUN 11 08/24/2015   CO2 28 08/24/2015    Mm Digital Screening Bilateral  01/24/2016  CLINICAL DATA:  Screening. EXAM: DIGITAL SCREENING BILATERAL MAMMOGRAM WITH CAD COMPARISON:  None. ACR Breast Density Category d:  The breast tissue is extremely dense, which lowers the sensitivity of mammography. FINDINGS: There are no findings suspicious for malignancy. Images were processed with CAD. IMPRESSION: No mammographic evidence of malignancy. A result letter of this screening mammogram will be mailed directly to the patient. RECOMMENDATION: Screening mammogram in one year. (Code:SM-B-01Y) BI-RADS CATEGORY  1: Negative. Electronically Signed   By: Amie Portland M.D.   On: 01/24/2016 16:58    Assessment & Plan:   There are no diagnoses linked to this encounter. I am having Ms. Mula maintain her citalopram, estradiol, and progesterone.  No orders of the defined types were placed in this encounter.     Follow-up: No Follow-up on file.  Sonda Primes, MD

## 2016-02-29 NOTE — Addendum Note (Signed)
Addended by: Tresa GarterPLOTNIKOV, Ronie Fleeger V on: 02/29/2016 05:22 PM   Modules accepted: Orders

## 2016-02-29 NOTE — Progress Notes (Signed)
Pre visit review using our clinic review tool, if applicable. No additional management support is needed unless otherwise documented below in the visit note. 

## 2016-02-29 NOTE — Telephone Encounter (Signed)
error 

## 2016-02-29 NOTE — Assessment & Plan Note (Addendum)
4/17 pt has finished Levaquin CXR

## 2016-02-29 NOTE — Assessment & Plan Note (Signed)
CXR in 3-4 wks - f/u w/Dr Okey Duprerawford Cefdinir x10 d

## 2016-02-29 NOTE — Assessment & Plan Note (Signed)
CXR Prom-cod Sudafed Anoro qd Depomedrol im 80 mg

## 2016-03-11 ENCOUNTER — Encounter: Payer: Self-pay | Admitting: Family Medicine

## 2016-03-11 ENCOUNTER — Ambulatory Visit (INDEPENDENT_AMBULATORY_CARE_PROVIDER_SITE_OTHER): Payer: Managed Care, Other (non HMO) | Admitting: Family Medicine

## 2016-03-11 VITALS — BP 118/78 | HR 67 | Temp 98.2°F | Resp 12 | Ht 67.0 in | Wt 137.0 lb

## 2016-03-11 DIAGNOSIS — R5383 Other fatigue: Secondary | ICD-10-CM

## 2016-03-11 DIAGNOSIS — J029 Acute pharyngitis, unspecified: Secondary | ICD-10-CM

## 2016-03-11 DIAGNOSIS — J181 Lobar pneumonia, unspecified organism: Secondary | ICD-10-CM | POA: Diagnosis not present

## 2016-03-11 NOTE — Patient Instructions (Addendum)
A few things to remember from today's visit:   1. Other fatigue Residual from recent pneumonia. For now monitor if it becomes chronic , > 6 months, we may need further work-up.  - CBC with Differential  2. Lobar pneumonia due to unspecified organism Improving. Chest X ray in 2-3 weeks, before if worsening symptoms.   - CBC with Differential - DG Chest 2 View; Future  3. Acute pharyngitis, unspecified etiology Most likely viral, symptomatic treatment recommended.       If you sign-up for My chart, you can communicate easier with us in case you have any question or concern.

## 2016-03-11 NOTE — Progress Notes (Signed)
Pre visit review using our clinic review tool, if applicable. No additional management support is needed unless otherwise documented below in the visit note. 

## 2016-03-11 NOTE — Progress Notes (Signed)
Subjective:    Patient ID: Shirley Wilson, female    DOB: 02/27/1956, 60 y.o.   MRN: [id number]  HPI   [name] is a 60 y.o.female here today complaining of about 30 days of respiratory symptoms and fatigue.  According to patient she started with respiratory symptoms on April 06/2016, she presented to acute care and diagnosed with flu-like symptoms, recommended symptomatic treatment.  Because no improvement she went back to acute care on April 16/2017, she was then diagnosed with acute bronchitis, treated symptomatically.  On April 18/2017 she was back to acute care diagnosed with acute bronchitis but at this time she was started on Levaquin which she took for 7 days. She went to see her primary care physician on April 28/2017 because not feeling any better, chest x-ray show possible right lower pneumonia, she was started on Cefdinir x 10 days, completed treatment yesterday.  Still having cough, exertional dyspnea but both have improved 75% and 50% respectively.Productive cough with thick green sputum but less amount. Appetite has also improved.  Dyspnea with prolonged walking, 15 min before she starts with difficulty breathing. She denies wheezing. She has had some non exertional chest wall "soreness", exacerbated by deep breathing and palpation, mild.Denies tachycardia, LE edema, or prolonged travel. She is on OCP's. Former smoker, quit about 7-8 years ago.  No fever, chills, or myalgias;the latter one she had it when symptoms first started. She is c/o persistent fatigue, sleeping most of the day.No sleep disorders, no Hx of fatigue, she is on Citalopram for menopausal symptoms.  Yesterday she started with sore throat and noted cervical lymphadenopathy, symptoms worse today morning but seems better now.  No Hx of recent travel. No sick contact. No known insect bite. + Hx of allergies: allergic rhinitis, seasonal.   Symptoms otherwise improved except for fatigue.   02/29/16  CXR: Right middle lobe pneumonia or atelectasis. Followup PA and lateral views of the chest are recommended in 3-4 weeks after antibiotic therapy trial to ensure resolution and rule out underlying neoplasm    Review of Systems  Constitutional: Positive for appetite change (improving) and fatigue. Negative for fever, chills and activity change.  HENT: Positive for postnasal drip and sore throat. Negative for congestion, ear pain, mouth sores, sinus pressure, sneezing, trouble swallowing and voice change.   Eyes: Negative for discharge, redness and itching.  Respiratory: Positive for cough and shortness of breath (exertional,improved). Negative for chest tightness and wheezing.   Cardiovascular: Positive for chest pain (chest wall soreness.). Negative for palpitations and leg swelling.  Gastrointestinal: Negative for nausea, vomiting, abdominal pain and diarrhea.  Genitourinary: Negative for dysuria, hematuria and decreased urine volume.  Musculoskeletal: Negative for myalgias, back pain, joint swelling and neck pain.  Skin: Negative for color change and rash.  Allergic/Immunologic: Negative for environmental allergies.  Neurological: Negative for weakness, numbness and headaches.  Hematological: Positive for adenopathy. Does not bruise/bleed easily.  Psychiatric/Behavioral: Negative for confusion and sleep disturbance.     Current Outpatient Prescriptions on File Prior to Visit  Medication Sig Dispense Refill  . estradiol (ESTRACE) 0.5 MG tablet Take 1 tablet (0.5 mg total) by mouth daily. 30 tablet 11  . progesterone (PROMETRIUM) 200 MG capsule Take 12 days per month 12 capsule 11  . promethazine-codeine (PHENERGAN WITH CODEINE) 6.25-10 MG/5ML syrup Take 5 mLs by mouth every 4 (four) hours as needed. 300 mL 0  . pseudoephedrine (SUDAFED) 120 MG 12 hr tablet Take 1 tablet (120 mg total) by mouth 2 (  two) times daily as needed for congestion. 30 tablet 1  . umeclidinium-vilanterol (ANORO  ELLIPTA) 62.5-25 MCG/INH AEPB Inhale 1 puff into the lungs daily. 1 each 3  . citalopram (CELEXA) 20 MG tablet Take 1 tablet (20 mg total) by mouth daily. (Patient not taking: Reported on 03/11/2016) 30 tablet 3   No current facility-administered medications on file prior to visit.     Past Medical History  Diagnosis Date  . Allergy   . Migraine   . Heart murmur     MVP    Social History   Social History  . Marital Status: Married    Spouse Name: N/A  . Number of Children: N/A  . Years of Education: N/A   Social History Main Topics  . Smoking status: Former Games developer  . Smokeless tobacco: Never Used  . Alcohol Use: 4.8 oz/week    8 Standard drinks or equivalent per week     Comment: weekly  . Drug Use: No  . Sexual Activity: Not Asked   Other Topics Concern  . None   Social History Narrative    Filed Vitals:   03/11/16 1509  BP: 118/78  Pulse: 67  Temp: 98.2 F (36.8 C)  Resp: 12   Body mass index is 21.45 kg/(m^2).  SpO2 Readings from Last 3 Encounters:  03/11/16 99%  02/29/16 96%  01/14/16 98%       Objective:   Physical Exam  Constitutional: She is oriented to person, place, and time. She appears well-developed and well-nourished. No distress.  Looks tired.  HENT:  Head: Atraumatic.  Right Ear: Tympanic membrane, external ear and ear canal normal.  Left Ear: Tympanic membrane, external ear and ear canal normal.  Nose: No mucosal edema or rhinorrhea. Right sinus exhibits no maxillary sinus tenderness and no frontal sinus tenderness. Left sinus exhibits no maxillary sinus tenderness and no frontal sinus tenderness.  Mouth/Throat: Uvula is midline and mucous membranes are normal. Posterior oropharyngeal erythema (mild) present. No oropharyngeal exudate or posterior oropharyngeal edema.  Post nasal drainage.  Eyes: Conjunctivae are normal.  Neck: Normal range of motion. No thyromegaly present.  Cardiovascular: Normal rate, regular rhythm and normal heart  sounds.   No murmur heard. Pulses:      Dorsalis pedis pulses are 2+ on the right side, and 2+ on the left side.  Pulmonary/Chest: Effort normal and breath sounds normal. No respiratory distress. She has no wheezes. She has no rales.  Abdominal: Soft. There is no hepatosplenomegaly. There is no tenderness.  Lymphadenopathy:       Head (right side): Submandibular adenopathy present.    She has cervical adenopathy (bilateral).       Right: No supraclavicular adenopathy present.       Left: No supraclavicular adenopathy present.  1 cm or less, mildly tender  Neurological: She is alert and oriented to person, place, and time. She has normal strength. No cranial nerve deficit. Gait normal.  Skin: Skin is warm. No rash noted.  Psychiatric: She has a normal mood and affect. Her speech is normal.  Good eye contact, well groomed.       Assessment & Plan:   Diagnoses and all orders for this visit:  Other fatigue  Most likely related with recent illness, I explained that it take might take a few weeks to completely resolve. For now we will continue monitor, CBC was ordered today, further recommendations will be given accordingly.  -     CBC with Differential  Lobar  pneumonia due to unspecified organism  Reporting improvement of respiratory symptoms. Cough and dyspnea have also improved, we discussed all other causes of dyspnea including some serious lung/CV conditions.  She prefers to hold on CT imaging for now and follow with chest x-ray as recommended by radiologist.  Clearly instructed about warning signs and she voices understanding.  -     CBC with Differential -     DG Chest 2 View; Future  Acute pharyngitis, unspecified etiology  This seems a new acute condition, which is most likely vital, so symptomatic treatment recommended for now. Monitor for new symptoms. Follow-up as needed.    -Patient advised to return or notify a doctor immediately if symptoms worsen or persist  or new concerns arise.     Carr Shartzer G. [place], MD  [facility]. [place] office.

## 2016-03-12 LAB — CBC WITH DIFFERENTIAL/PLATELET
BASOS ABS: 0.1 10*3/uL (ref 0.0–0.1)
Basophils Relative: 1.1 % (ref 0.0–3.0)
EOS ABS: 0.2 10*3/uL (ref 0.0–0.7)
Eosinophils Relative: 3.3 % (ref 0.0–5.0)
HCT: 42.5 % (ref 36.0–46.0)
Hemoglobin: 13.9 g/dL (ref 12.0–15.0)
LYMPHS ABS: 2.5 10*3/uL (ref 0.7–4.0)
Lymphocytes Relative: 37.4 % (ref 12.0–46.0)
MCHC: 32.6 g/dL (ref 30.0–36.0)
MCV: 89.4 fl (ref 78.0–100.0)
Monocytes Absolute: 0.4 10*3/uL (ref 0.1–1.0)
Monocytes Relative: 6.4 % (ref 3.0–12.0)
NEUTROS ABS: 3.4 10*3/uL (ref 1.4–7.7)
Neutrophils Relative %: 51.8 % (ref 43.0–77.0)
PLATELETS: 333 10*3/uL (ref 150.0–400.0)
RBC: 4.75 Mil/uL (ref 3.87–5.11)
RDW: 14.2 % (ref 11.5–15.5)
WBC: 6.6 10*3/uL (ref 4.0–10.5)

## 2016-03-13 ENCOUNTER — Encounter: Payer: Self-pay | Admitting: Family Medicine

## 2017-01-22 ENCOUNTER — Telehealth: Payer: Self-pay | Admitting: Internal Medicine

## 2017-01-22 MED ORDER — FLUCONAZOLE 150 MG PO TABS: 150.0000 mg | ORAL_TABLET | Freq: Once | ORAL | 0 refills | Status: AC

## 2017-01-22 NOTE — Telephone Encounter (Signed)
Sent in diflucan and needs visit if no resolution.

## 2017-01-22 NOTE — Telephone Encounter (Signed)
Pt called states she has had yeast infection for 2 weeks, tried to treat with over the counter med. Would like prescription called in for her.

## 2017-01-22 NOTE — Telephone Encounter (Signed)
LVM with medication name and to make visit if it doesn't resolve

## 2017-03-09 IMAGING — DX DG CHEST 2V
2 series · 2 of 2 positions shown · non-contrast
Comparison: None.

CLINICAL DATA: Cough, shortness of breath.

EXAM:
CHEST  2 VIEW

[chest pa]
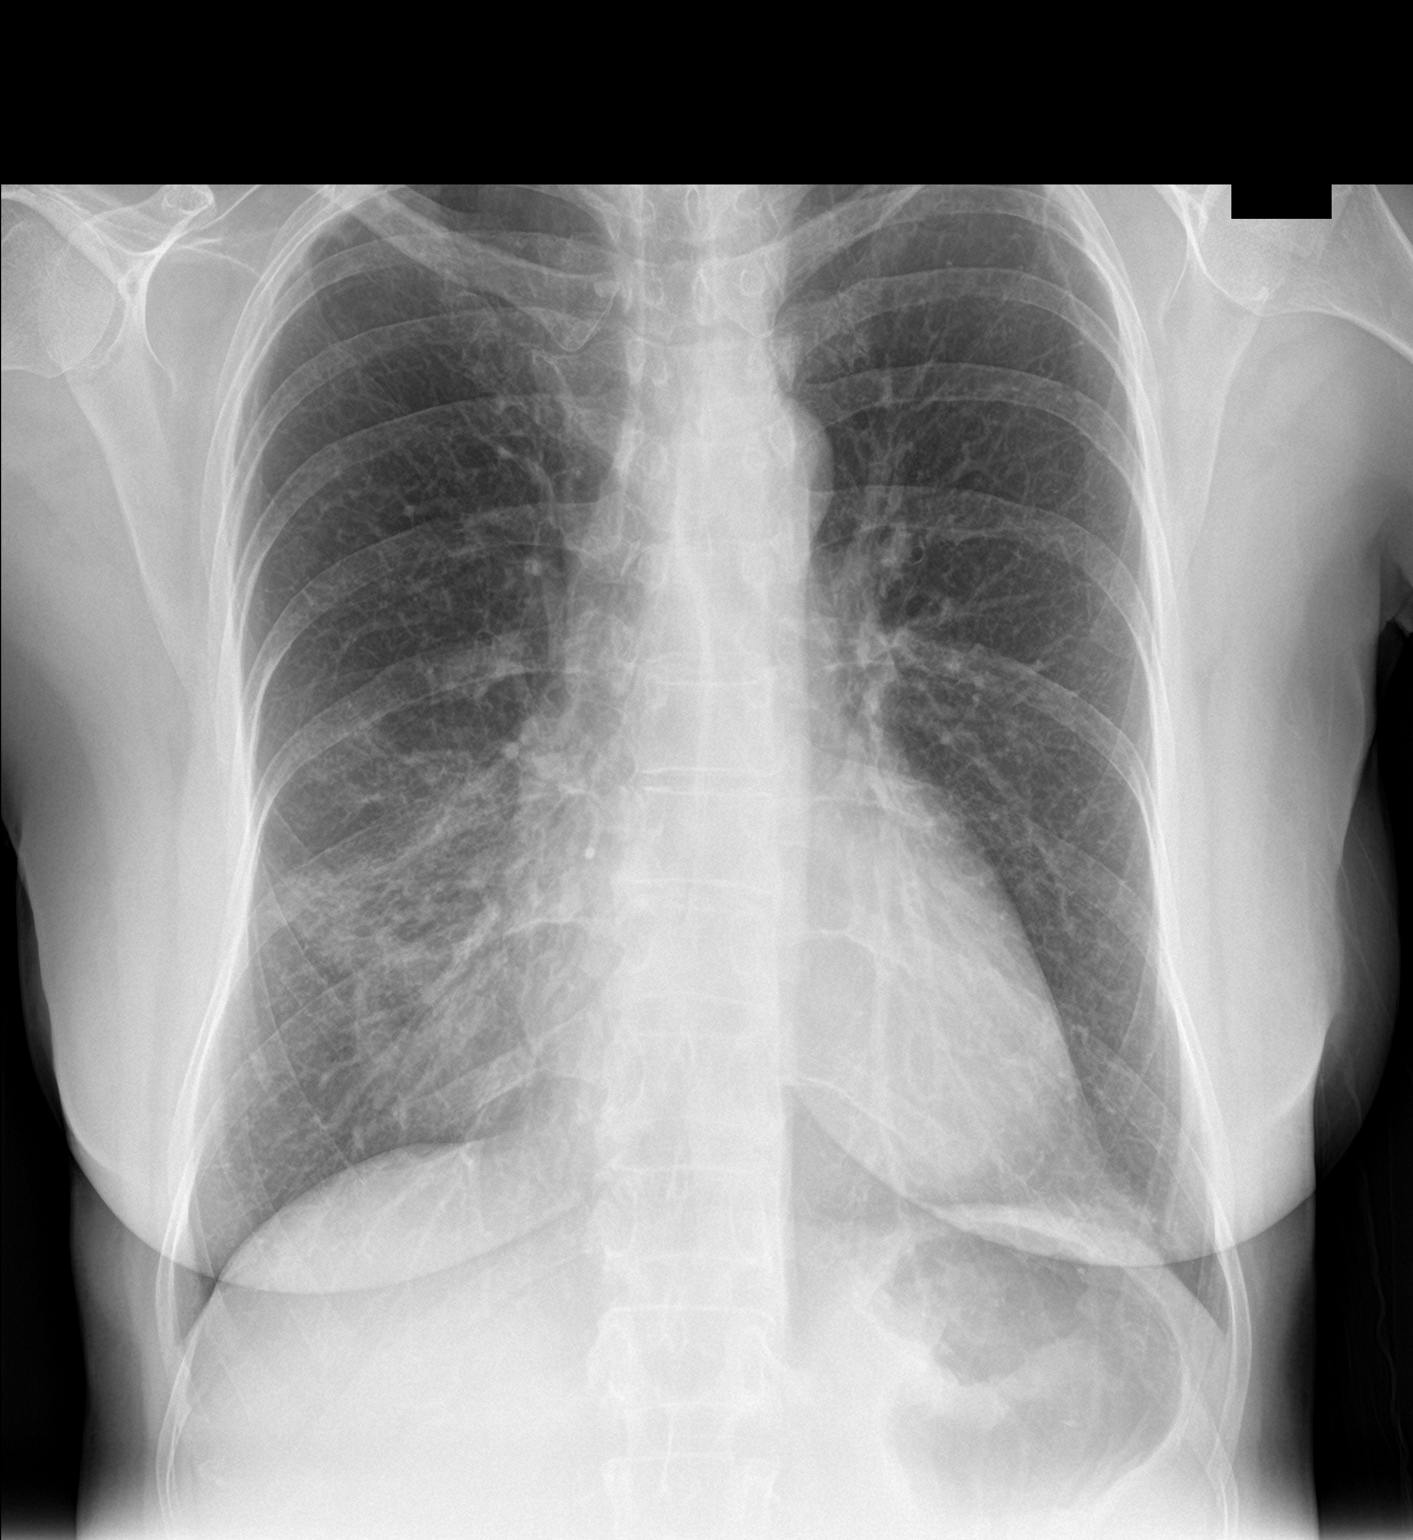

[chest lat]
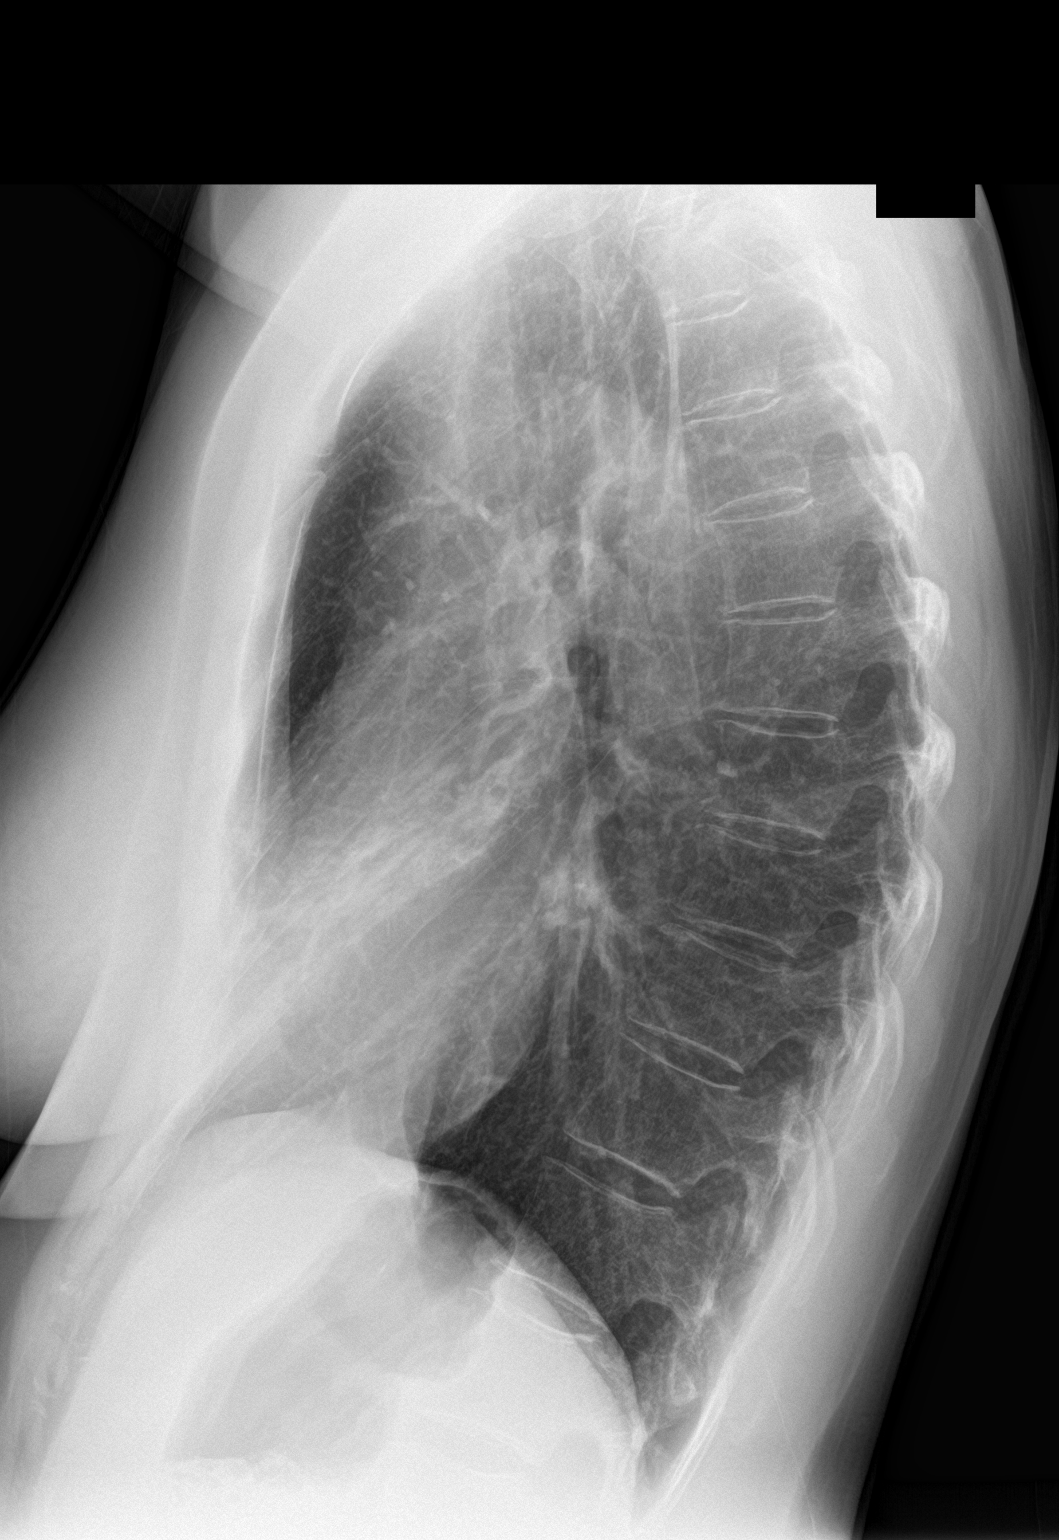

[2 of 2 positions shown; findings below may reference images not displayed]

FINDINGS: The heart size and mediastinal contours are within normal limits. No
pneumothorax or pleural effusion is noted. Right middle lobe
pneumonia or atelectasis is noted. The visualized skeletal
structures are unremarkable.
IMPRESSION: Right middle lobe pneumonia or atelectasis. Followup PA and lateral
views of the chest are recommended in 3-4 weeks after antibiotic
therapy trial to ensure resolution and rule out underlying neoplasm.

## 2018-02-15 ENCOUNTER — Encounter: Payer: Self-pay | Admitting: Internal Medicine

## 2018-03-08 LAB — HM MAMMOGRAPHY

## 2018-04-06 ENCOUNTER — Encounter: Payer: Self-pay | Admitting: Internal Medicine

## 2018-05-13 ENCOUNTER — Encounter: Payer: Self-pay | Admitting: Nurse Practitioner

## 2018-05-13 ENCOUNTER — Ambulatory Visit: Payer: BC Managed Care – PPO | Admitting: Nurse Practitioner

## 2018-05-13 VITALS — BP 152/80 | HR 61 | Temp 97.8°F | Resp 16 | Ht 67.0 in | Wt 139.0 lb

## 2018-05-13 DIAGNOSIS — T7840XA Allergy, unspecified, initial encounter: Secondary | ICD-10-CM

## 2018-05-13 DIAGNOSIS — R030 Elevated blood-pressure reading, without diagnosis of hypertension: Secondary | ICD-10-CM

## 2018-05-13 MED ORDER — PREDNISONE 20 MG PO TABS
20.0000 mg | ORAL_TABLET | Freq: Every day | ORAL | 0 refills | Status: DC
Start: 2018-05-13 — End: 2018-10-21

## 2018-05-13 NOTE — Progress Notes (Signed)
Name: Shirley Wilson   MRN: [id number]    DOB: Mar 30, 1956   Date:05/13/2018       Progress Note  Subjective  Chief Complaint  Chief Complaint  Patient presents with  . Allergic Reaction    for the last 11 days has had rash, facial swelling, congestion     HPI  Shirley Wilson is here today for evaluation of acute complaint of allergic reaction The reaction began on July 1, after using a new facial cream. She first noticed burning sensation to her face a day after using the facial cream then shortly after noticed a rash on her face which has since spread to her entire body and today began to notice tingling sensation in her lips and tongue. She says her eyes seemed swollen at one point, but have improved. She denies fevers, difficulty swallowing or breathing, chest pain, shortness of breath, abdominal pain, nausea, vomiting. She reports multiple allergic reactions to body creams over the past several years. She denies any new medications or foods She has been using OTC antihistamine and decongestant daily without any symptom improvement.  Patient Active Problem List   Diagnosis Date Noted  . Asthmatic bronchitis 02/29/2016  . Subacute bronchitis 02/29/2016  . RML pneumonia (HCC) 02/29/2016  . Hot flashes 01/15/2016  . Motion sickness 09/06/2015  . Routine general medical examination at a health care facility 08/19/2015    Social History   Tobacco Use  . Smoking status: Former Games developer  . Smokeless tobacco: Never Used  Substance Use Topics  . Alcohol use: Yes    Alcohol/week: 4.8 oz    Types: 8 Standard drinks or equivalent per week    Comment: weekly     Current Outpatient Medications:  .  estradiol (ESTRACE) 0.5 MG tablet, Take 1 tablet (0.5 mg total) by mouth daily., Disp: 30 tablet, Rfl: 11 .  progesterone (PROMETRIUM) 200 MG capsule, Take 12 days per month, Disp: 12 capsule, Rfl: 11 .  predniSONE (DELTASONE) 20 MG tablet, Take 1 tablet (20 mg total) by mouth daily with  breakfast., Disp: 10 tablet, Rfl: 0  Allergies  Allergen Reactions  . Sulfa Antibiotics     Rash all over    ROS  No other specific complaints in a complete review of systems (except as listed in HPI above).  Objective  Vitals:   05/13/18 1449 05/13/18 1516  BP: (!) 152/90 (!) 152/80  Pulse: 61   Resp: 16   Temp: 97.8 F (36.6 C)   TempSrc: Oral   SpO2: 98%   Weight: 139 lb (63 kg)   Height: 5\' 7"  (1.702 m)    Body mass index is 21.77 kg/m.  Nursing Note and Vital Signs reviewed.  Physical Exam  Constitutional: Patient appears well-developed and well-nourished.  No distress.  HEENT: head atraumatic, normocephalic, pupils equal and reactive to light, EOM's intact, TM's without erythema or bulging,  no maxillary or frontal sinus tenderness , neck supple without lymphadenopathy, oropharynx pink and moist without exudate. No oral or facial swelling noted. Cardiovascular: Normal rate, regular rhythm, S1/S2 present.  Distal pulses intact. Pulmonary/Chest: Effort normal and breath sounds clear. No respiratory distress or retractions. Neurological: Gaston Islam is alert and oriented to person, place, and time. No cranial nerve deficit. Coordination, balance, strength, speech and gait are normal.  Skin: Skin is warm and dry. Light maculopapular rash noted to trunk, arms. Psychiatric: Patient has a normal mood and affect. behavior is normal. Judgment and thought content normal.  Assessment & Plan  1. Allergic reaction, initial encounter No oral swelling or hives noted, she overall feels well, I do not suspect this is anaphylaxis Will prescribe course of prednisone for allergic reaction-dosing and side effects discussed -Red flags and when to present for emergency care or RTC including fever >101.51F, chest pain, shortness of breath, new/worsening/un-resolving symptoms,  reviewed with patient at time of visit. Follow up and care instructions discussed and provided in AVS. - predniSONE  (DELTASONE) 20 MG tablet; Take 1 tablet (20 mg total) by mouth daily with breakfast.  Dispense: 10 tablet; Refill: 0  2. Elevated blood pressure reading Elevated on two readings today with normal past readings She is drinking monster caffeine drink during visit and has been taking OTC decongestants over past week which could contribute to elevated BP reading She will RTC in 2 weeks for F/U blood pressure reading

## 2018-05-13 NOTE — Patient Instructions (Signed)
Please start prednisone 40 mg once daily for 5 days.  Please follow up if your symptoms are not better after the prednisone, of course sooner if symptoms worsen as we discussed  Anaphylactic Reaction An anaphylactic reaction (anaphylaxis) is a sudden allergic reaction that is very bad (severe). It also affects more than one part of the body. This condition can be life-threatening. If you have an anaphylactic reaction, you need to get medical help right away. You may need to stay in the hospital. Your doctor may teach you how to use an allergy kit (anaphylaxis kit) and how to give yourself an allergy shot (epinephrine injection). You can give yourself an allergy shot with what is commonly called an auto-injector "pen." Symptoms of an anaphylactic reaction may include:  A stuffy nose (nasal congestion).  Headache.  Tingling in your mouth.  A flushed face.  An itchy, red rash.  Swelling of your eyes, lips, face, or tongue.  Swelling of the back of your mouth and your throat.  Breathing loudly (wheezing).  A hoarse voice.  Itchy, red, swollen areas of skin (hives).  Dizziness or light-headedness.  Passing out (fainting).  Feeling worried or nervous (anxiety).  Feeling confused.  Pain in your belly (abdomen) or chest.  Trouble with breathing, talking, or swallowing.  A tight feeling in your chest or throat.  Fast or uneven heartbeats (palpitations).  Throwing up (vomiting).  Watery poop (diarrhea).  Follow these instructions at home: Safety  Always keep an auto-injector pen or your allergy kit with you. These could save your life. Use them as told by your doctor.  Do not drive until your doctor says that it is safe.  Make sure that you, the people who live with you, and your employer know: ? How to use your allergy kit. ? How to use an auto-injector pen to give you an allergy shot.  If you used your auto-injector pen: ? Get more medicine for it right away. This  is important in case you have another reaction. ? Get help right away.  Wear a bracelet or necklace that says you have an allergy, if your doctor tells you to do this.  Learn the signs of a very bad allergic reaction.  Work with your doctors to make a plan for what to do if you have a very bad allergic reaction. Being prepared is important. General instructions  Take over-the-counter and prescription medicines only as told by your doctor.  If you have itchy, red, swollen areas of skin or a rash: ? Use over-the-counter medicine (antihistamine) as told by your doctor. ? Put cold, wet cloths (cold compresses) on your skin. ? Take baths or showers in cool water. Avoid hot water.  If you had tests done, it is up to you to get your test results. Ask your doctor when your results will be ready.  Tell any doctors who care for you that you have an allergy.  Keep all follow-up visits as told by your doctor. This is important. How is this prevented?  Avoid things (allergens) that gave you a very bad allergic reaction before.  If you have a food allergy and you go to a restaurant, tell your server about your allergy. If you are not sure if your meal was made with food that you are allergic to, ask your server before you eat it. Contact a doctor if:  You have symptoms of an allergic reaction. You may notice them soon after being around whatever it is that  you are allergic to. Symptoms may include: ? A rash. ? A headache. ? Sneezing or a runny nose. ? Swelling. ? Feeling sick to your stomach. ? Watery poop. Get help right away if:  You had to use your auto-injector pen. You must go to the emergency room even if the medicine seems to be working.  You have any of these: ? A tight feeling in your chest or your throat. ? Loud breathing. ? Trouble with breathing. ? Itchy, red, swollen areas of skin. ? Red skin or itching all over your body. ? Swelling in your lips, tongue, or the back of  your throat.  You have throwing up that gets very bad.  You have watery poop that gets very bad.  You pass out or feel like you might pass out. These symptoms may be an emergency. Do not wait to see if the symptoms will go away. Use your auto-injector pen or allergy kit as you have been told. Get medical help right away. Call your local emergency services (911 in the [place] Do not drive yourself to the hospital. Summary  An anaphylactic reaction (anaphylaxis) is a sudden allergic reaction that is very bad (severe).  This condition can be life-threatening. If you have an anaphylactic reaction, you need to get medical help right away.  Your doctor may teach you how to use an allergy kit (anaphylaxis kit) and how to give yourself an allergy shot (epinephrine injection) with an auto-injector "pen."  Always keep an auto-injector pen or your allergy kit with you. These could save your life. Use them as told by your doctor.  If you had to use your auto-injector pen, you must go to the emergency room even if the medicine seems to be working. This information is not intended to replace advice given to you by your health care provider. Make sure you discuss any questions you have with your health care provider. Document Released: 04/07/2008 Document Revised: 06/13/2016 Document Reviewed: 06/13/2016 Elsevier Interactive Patient Education  2017 [employer].

## 2018-05-18 ENCOUNTER — Encounter: Payer: Self-pay | Admitting: Internal Medicine

## 2018-09-29 ENCOUNTER — Ambulatory Visit (INDEPENDENT_AMBULATORY_CARE_PROVIDER_SITE_OTHER): Payer: BC Managed Care – PPO

## 2018-09-29 DIAGNOSIS — Z23 Encounter for immunization: Secondary | ICD-10-CM | POA: Diagnosis not present

## 2018-10-21 ENCOUNTER — Ambulatory Visit (INDEPENDENT_AMBULATORY_CARE_PROVIDER_SITE_OTHER): Payer: BC Managed Care – PPO | Admitting: Internal Medicine

## 2018-10-21 ENCOUNTER — Encounter: Payer: Self-pay | Admitting: Internal Medicine

## 2018-10-21 ENCOUNTER — Other Ambulatory Visit: Payer: BC Managed Care – PPO

## 2018-10-21 VITALS — BP 112/70 | HR 67 | Temp 97.9°F | Ht 67.0 in | Wt 143.0 lb

## 2018-10-21 DIAGNOSIS — Z23 Encounter for immunization: Secondary | ICD-10-CM

## 2018-10-21 DIAGNOSIS — Z124 Encounter for screening for malignant neoplasm of cervix: Secondary | ICD-10-CM | POA: Diagnosis not present

## 2018-10-21 DIAGNOSIS — R232 Flushing: Secondary | ICD-10-CM

## 2018-10-21 DIAGNOSIS — Z0000 Encounter for general adult medical examination without abnormal findings: Secondary | ICD-10-CM | POA: Diagnosis not present

## 2018-10-21 NOTE — Patient Instructions (Signed)
We will send you the results of the pap smear.   Health Maintenance, Female Adopting a healthy lifestyle and getting preventive care can go a long way to promote health and wellness. Talk with your health care provider about what schedule of regular examinations is right for you. This is a good chance for you to check in with your provider about disease prevention and staying healthy. In between checkups, there are plenty of things you can do on your own. Experts have done a lot of research about which lifestyle changes and preventive measures are most likely to keep you healthy. Ask your health care provider for more information. Weight and diet Eat a healthy diet  Be sure to include plenty of vegetables, fruits, low-fat dairy products, and lean protein.  Do not eat a lot of foods high in solid fats, added sugars, or salt.  Get regular exercise. This is one of the most important things you can do for your health. ? Most adults should exercise for at least 150 minutes each week. The exercise should increase your heart rate and make you sweat (moderate-intensity exercise). ? Most adults should also do strengthening exercises at least twice a week. This is in addition to the moderate-intensity exercise. Maintain a healthy weight  Body mass index (BMI) is a measurement that can be used to identify possible weight problems. It estimates body fat based on height and weight. Your health care provider can help determine your BMI and help you achieve or maintain a healthy weight.  For females 43 years of age and older: ? A BMI below 18.5 is considered underweight. ? A BMI of 18.5 to 24.9 is normal. ? A BMI of 25 to 29.9 is considered overweight. ? A BMI of 30 and above is considered obese. Watch levels of cholesterol and blood lipids  You should start having your blood tested for lipids and cholesterol at 61 years of age, then have this test every 5 years.  You may need to have your cholesterol  levels checked more often if: ? Your lipid or cholesterol levels are high. ? You are older than 61 years of age. ? You are at high risk for heart disease. Cancer screening Lung Cancer  Lung cancer screening is recommended for adults 52-40 years old who are at high risk for lung cancer because of a history of smoking.  A yearly low-dose CT scan of the lungs is recommended for people who: ? Currently smoke. ? Have quit within the past 15 years. ? Have at least a 30-pack-year history of smoking. A pack year is smoking an average of one pack of cigarettes a day for 1 year.  Yearly screening should continue until it has been 15 years since you quit.  Yearly screening should stop if you develop a health problem that would prevent you from having lung cancer treatment. Breast Cancer  Practice breast self-awareness. This means understanding how your breasts normally appear and feel.  It also means doing regular breast self-exams. Let your health care provider know about any changes, no matter how small.  If you are in your 20s or 30s, you should have a clinical breast exam (CBE) by a health care provider every 1-3 years as part of a regular health exam.  If you are 99 or older, have a CBE every year. Also consider having a breast X-ray (mammogram) every year.  If you have a family history of breast cancer, talk to your health care provider about genetic  screening.  If you are at high risk for breast cancer, talk to your health care provider about having an MRI and a mammogram every year.  Breast cancer gene (BRCA) assessment is recommended for women who have family members with BRCA-related cancers. BRCA-related cancers include: ? Breast. ? Ovarian. ? Tubal. ? Peritoneal cancers.  Results of the assessment will determine the need for genetic counseling and BRCA1 and BRCA2 testing. Cervical Cancer Your health care provider may recommend that you be screened regularly for cancer of the  pelvic organs (ovaries, uterus, and vagina). This screening involves a pelvic examination, including checking for microscopic changes to the surface of your cervix (Pap test). You may be encouraged to have this screening done every 3 years, beginning at age 42.  For women ages 72-65, health care providers may recommend pelvic exams and Pap testing every 3 years, or they may recommend the Pap and pelvic exam, combined with testing for human papilloma virus (HPV), every 5 years. Some types of HPV increase your risk of cervical cancer. Testing for HPV may also be done on women of any age with unclear Pap test results.  Other health care providers may not recommend any screening for nonpregnant women who are considered low risk for pelvic cancer and who do not have symptoms. Ask your health care provider if a screening pelvic exam is right for you.  If you have had past treatment for cervical cancer or a condition that could lead to cancer, you need Pap tests and screening for cancer for at least 20 years after your treatment. If Pap tests have been discontinued, your risk factors (such as having a new sexual partner) need to be reassessed to determine if screening should resume. Some women have medical problems that increase the chance of getting cervical cancer. In these cases, your health care provider may recommend more frequent screening and Pap tests. Colorectal Cancer  This type of cancer can be detected and often prevented.  Routine colorectal cancer screening usually begins at 61 years of age and continues through 61 years of age.  Your health care provider may recommend screening at an earlier age if you have risk factors for colon cancer.  Your health care provider may also recommend using home test kits to check for hidden blood in the stool.  A small camera at the end of a tube can be used to examine your colon directly (sigmoidoscopy or colonoscopy). This is done to check for the earliest  forms of colorectal cancer.  Routine screening usually begins at age 43.  Direct examination of the colon should be repeated every 5-10 years through 61 years of age. However, you may need to be screened more often if early forms of precancerous polyps or small growths are found. Skin Cancer  Check your skin from head to toe regularly.  Tell your health care provider about any new moles or changes in moles, especially if there is a change in a mole's shape or color.  Also tell your health care provider if you have a mole that is larger than the size of a pencil eraser.  Always use sunscreen. Apply sunscreen liberally and repeatedly throughout the day.  Protect yourself by wearing long sleeves, pants, a wide-brimmed hat, and sunglasses whenever you are outside. Heart disease, diabetes, and high blood pressure  High blood pressure causes heart disease and increases the risk of stroke. High blood pressure is more likely to develop in: ? People who have blood pressure in  the high end of the normal range (130-139/85-89 mm Hg). ? People who are overweight or obese. ? People who are African American.  If you are 100-75 years of age, have your blood pressure checked every 3-5 years. If you are 62 years of age or older, have your blood pressure checked every year. You should have your blood pressure measured twice-once when you are at a hospital or clinic, and once when you are not at a hospital or clinic. Record the average of the two measurements. To check your blood pressure when you are not at a hospital or clinic, you can use: ? An automated blood pressure machine at a pharmacy. ? A home blood pressure monitor.  If you are between 31 years and 86 years old, ask your health care provider if you should take aspirin to prevent strokes.  Have regular diabetes screenings. This involves taking a blood sample to check your fasting blood sugar level. ? If you are at a normal weight and have a low  risk for diabetes, have this test once every three years after 61 years of age. ? If you are overweight and have a high risk for diabetes, consider being tested at a younger age or more often. Preventing infection Hepatitis B  If you have a higher risk for hepatitis B, you should be screened for this virus. You are considered at high risk for hepatitis B if: ? You were born in a country where hepatitis B is common. Ask your health care provider which countries are considered high risk. ? Your parents were born in a high-risk country, and you have not been immunized against hepatitis B (hepatitis B vaccine). ? You have HIV or AIDS. ? You use needles to inject street drugs. ? You live with someone who has hepatitis B. ? You have had sex with someone who has hepatitis B. ? You get hemodialysis treatment. ? You take certain medicines for conditions, including cancer, organ transplantation, and autoimmune conditions. Hepatitis C  Blood testing is recommended for: ? Everyone born from [birth year] through 1964. ? Anyone with known risk factors for hepatitis C. Sexually transmitted infections (STIs)  You should be screened for sexually transmitted infections (STIs) including gonorrhea and chlamydia if: ? You are sexually active and are younger than 61 years of age. ? You are older than 61 years of age and your health care provider tells you that you are at risk for this type of infection. ? Your sexual activity has changed since you were last screened and you are at an increased risk for chlamydia or gonorrhea. Ask your health care provider if you are at risk.  If you do not have HIV, but are at risk, it may be recommended that you take a prescription medicine daily to prevent HIV infection. This is called pre-exposure prophylaxis (PrEP). You are considered at risk if: ? You are sexually active and do not regularly use condoms or know the HIV status of your partner(s). ? You take drugs by  injection. ? You are sexually active with a partner who has HIV. Talk with your health care provider about whether you are at high risk of being infected with HIV. If you choose to begin PrEP, you should first be tested for HIV. You should then be tested every 3 months for as long as you are taking PrEP. Pregnancy  If you are premenopausal and you may become pregnant, ask your health care provider about preconception counseling.  If you may  become pregnant, take 400 to 800 micrograms (mcg) of folic acid every day.  If you want to prevent pregnancy, talk to your health care provider about birth control (contraception). Osteoporosis and menopause  Osteoporosis is a disease in which the bones lose minerals and strength with aging. This can result in serious bone fractures. Your risk for osteoporosis can be identified using a bone density scan.  If you are 50 years of age or older, or if you are at risk for osteoporosis and fractures, ask your health care provider if you should be screened.  Ask your health care provider whether you should take a calcium or vitamin D supplement to lower your risk for osteoporosis.  Menopause may have certain physical symptoms and risks.  Hormone replacement therapy may reduce some of these symptoms and risks. Talk to your health care provider about whether hormone replacement therapy is right for you. Follow these instructions at home:  Schedule regular health, dental, and eye exams.  Stay current with your immunizations.  Do not use any tobacco products including cigarettes, chewing tobacco, or electronic cigarettes.  If you are pregnant, do not drink alcohol.  If you are breastfeeding, limit how much and how often you drink alcohol.  Limit alcohol intake to no more than 1 drink per day for nonpregnant women. One drink equals 12 ounces of beer, 5 ounces of wine, or 1 ounces of hard liquor.  Do not use street drugs.  Do not share needles.  Ask  your health care provider for help if you need support or information about quitting drugs.  Tell your health care provider if you often feel depressed.  Tell your health care provider if you have ever been abused or do not feel safe at home. This information is not intended to replace advice given to you by your health care provider. Make sure you discuss any questions you have with your health care provider. Document Released: 05/05/2011 Document Revised: 03/27/2016 Document Reviewed: 07/24/2015 Elsevier Interactive Patient Education  2019 [employer].

## 2018-10-21 NOTE — Progress Notes (Signed)
   Subjective:    Patient ID: Shirley Wilson, female    DOB: 07/17/1956, 62 y.o.   MRN: [id number]  HPI The patient is a 62 YO female coming in for physical.   PMH, FMH, social history reviewed and updated  Review of Systems  Constitutional: Negative.   HENT: Negative.   Eyes: Negative.   Respiratory: Negative for cough, chest tightness and shortness of breath.   Cardiovascular: Negative for chest pain, palpitations and leg swelling.  Gastrointestinal: Negative for abdominal distention, abdominal pain, constipation, diarrhea, nausea and vomiting.  Musculoskeletal: Negative.   Skin: Negative.   Neurological: Negative.   Psychiatric/Behavioral: Negative.       Objective:   Physical Exam Constitutional:      Appearance: She is well-developed.  HENT:     Head: Normocephalic and atraumatic.  Neck:     Musculoskeletal: Normal range of motion.  Cardiovascular:     Rate and Rhythm: Normal rate and regular rhythm.  Pulmonary:     Effort: Pulmonary effort is normal. No respiratory distress.     Breath sounds: Normal breath sounds. No wheezing or rales.  Abdominal:     General: Bowel sounds are normal. There is no distension.     Palpations: Abdomen is soft.     Tenderness: There is no abdominal tenderness. There is no rebound.  Genitourinary:    General: Normal vulva.     Vagina: No vaginal discharge.  Skin:    General: Skin is warm and dry.  Neurological:     Mental Status: She is alert and oriented to person, place, and time.     Coordination: Coordination normal.    Vitals:   10/21/18 1542  BP: 112/70  Pulse: 67  Temp: 97.9 F (36.6 C)  TempSrc: Oral  SpO2: 99%  Weight: 143 lb (64.9 kg)  Height: 5\' 7"  (1.702 m)      Assessment & Plan:  Shingles IM given

## 2018-10-22 ENCOUNTER — Other Ambulatory Visit (HOSPITAL_COMMUNITY)
Admission: RE | Admit: 2018-10-22 | Discharge: 2018-10-22 | Disposition: A | Discharge status: Home / Self Care | Payer: BC Managed Care – PPO | Source: Ambulatory Visit | Attending: Internal Medicine | Admitting: Internal Medicine

## 2018-10-22 DIAGNOSIS — Z124 Encounter for screening for malignant neoplasm of cervix: Secondary | ICD-10-CM | POA: Insufficient documentation

## 2018-10-22 NOTE — Assessment & Plan Note (Signed)
Still taking estrace and progesterone.

## 2018-10-22 NOTE — Assessment & Plan Note (Signed)
Flu shot up to date. Shingrix given 1st at visit. Tetanus up to date. Colonoscopy up to date. Mammogram up to date, pap smear done at visit. Counseled about sun safety and mole surveillance. Counseled about the dangers of distracted driving. Given 10 year screening recommendations.

## 2018-10-26 LAB — CYTOLOGY - PAP: Diagnosis: NEGATIVE

## 2018-11-09 ENCOUNTER — Ambulatory Visit: Payer: BC Managed Care – PPO | Admitting: Nurse Practitioner

## 2018-11-09 ENCOUNTER — Encounter: Payer: Self-pay | Admitting: Nurse Practitioner

## 2018-11-09 VITALS — BP 126/76 | HR 72 | Temp 98.0°F | Ht 67.0 in | Wt 139.2 lb

## 2018-11-09 DIAGNOSIS — J209 Acute bronchitis, unspecified: Secondary | ICD-10-CM | POA: Diagnosis not present

## 2018-11-09 MED ORDER — PREDNISONE 20 MG PO TABS: 40.0000 mg | ORAL_TABLET | Freq: Every day | ORAL | 0 refills | Status: DC

## 2018-11-09 MED ORDER — ALBUTEROL SULFATE HFA 108 (90 BASE) MCG/ACT IN AERS: 1.0000 | INHALATION_SPRAY | Freq: Four times a day (QID) | RESPIRATORY_TRACT | 0 refills | Status: AC | PRN

## 2018-11-09 MED ORDER — BENZONATATE 100 MG PO CAPS: 100.0000 mg | ORAL_CAPSULE | Freq: Three times a day (TID) | ORAL | 0 refills | Status: DC | PRN

## 2018-11-09 MED ORDER — AMOXICILLIN-POT CLAVULANATE 875-125 MG PO TABS: 1.0000 | ORAL_TABLET | Freq: Two times a day (BID) | ORAL | 0 refills | Status: DC

## 2018-11-09 NOTE — Patient Instructions (Signed)
Acute Bronchitis, Adult Acute bronchitis is when air tubes (bronchi) in the lungs suddenly get swollen. The condition can make it hard to breathe. It can also cause these symptoms:  A cough.  Coughing up clear, yellow, or green mucus.  Wheezing.  Chest congestion.  Shortness of breath.  A fever.  Body aches.  Chills.  A sore throat. Follow these instructions at home:  Medicines  Take over-the-counter and prescription medicines only as told by your doctor.  If you were prescribed an antibiotic medicine, take it as told by your doctor. Do not stop taking the antibiotic even if you start to feel better. General instructions  Rest.  Drink enough fluids to keep your pee (urine) pale yellow.  Avoid smoking and secondhand smoke. If you smoke and you need help quitting, ask your doctor. Quitting will help your lungs heal faster.  Use an inhaler, cool mist vaporizer, or humidifier as told by your doctor.  Keep all follow-up visits as told by your doctor. This is important. How is this prevented? To lower your risk of getting this condition again:  Wash your hands often with soap and water. If you cannot use soap and water, use hand sanitizer.  Avoid contact with people who have cold symptoms.  Try not to touch your hands to your mouth, nose, or eyes.  Make sure to get the flu shot every year. Contact a doctor if:  Your symptoms do not get better in 2 weeks. Get help right away if:  You cough up blood.  You have chest pain.  You have very bad shortness of breath.  You become dehydrated.  You faint (pass out) or keep feeling like you are going to pass out.  You keep throwing up (vomiting).  You have a very bad headache.  Your fever or chills gets worse. This information is not intended to replace advice given to you by your health care provider. Make sure you discuss any questions you have with your health care provider. Document Released: 04/07/2008 Document  Revised: 06/03/2017 Document Reviewed: 04/09/2016 Elsevier Interactive Patient Education  2019 [employer]  

## 2018-11-09 NOTE — Progress Notes (Addendum)
Subjective:  Patient ID: Shirley Wilson, female    DOB: 03/24/1956  Age: 63 y.o. 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 The current episode started 1 to 4 weeks ago. The problem has been waxing and waning. The cough is productive of purulent sputum. Associated symptoms include chest pain, chills, ear congestion, ear pain, headaches, myalgias, nasal congestion, postnasal drip, rhinorrhea, a sore throat and shortness of breath. Pertinent negatives include no heartburn, sweats or wheezing. She has tried OTC cough suppressant for the symptoms. The treatment provided no relief. There is no history of asthma, bronchitis or environmental allergies.    Reviewed past Medical, Social and Family history today.  Outpatient Medications Prior to Visit  Medication Sig Dispense Refill  . estradiol (ESTRACE) 0.5 MG tablet Take 1 tablet (0.5 mg total) by mouth daily. (Patient taking differently: Take 0.5 mg by mouth daily. ) 30 tablet 11  . progesterone (PROMETRIUM) 200 MG capsule Take 12 days per month 12 capsule 11   No facility-administered medications prior to visit.     ROS See HPI  Objective:  BP 126/76   Pulse 72   Temp 98 F (36.7 C) (Oral)   Ht 5\' 7"  (1.702 m)   Wt 139 lb 3.2 oz (63.1 kg)   LMP  (LMP Unknown)   SpO2 97%   BMI 21.80 kg/m   BP Readings from Last 3 Encounters:  11/09/18 126/76  10/21/18 112/70  05/13/18 (!) 152/80    Wt Readings from Last 3 Encounters:  11/09/18 139 lb 3.2 oz (63.1 kg)  10/21/18 143 lb (64.9 kg)  05/13/18 139 lb (63 kg)    Physical Exam Constitutional:      Appearance: She is ill-appearing.  HENT:     Right Ear: Tympanic membrane, ear canal and external ear normal.     Left Ear: Tympanic membrane, ear canal and external ear normal.     Nose:     Right Turbinates: Swollen.     Left Turbinates:  Swollen.     Right Sinus: Maxillary sinus tenderness and frontal sinus tenderness present.     Left Sinus: Maxillary sinus tenderness and frontal sinus tenderness present.     Mouth/Throat:     Pharynx: Posterior oropharyngeal erythema present. No oropharyngeal exudate.     Tonsils: Swelling: 2+ on the right. 2+ on the left.  Cardiovascular:     Rate and Rhythm: Normal rate and regular rhythm.  Pulmonary:     Effort: Pulmonary effort is normal.     Breath sounds: Normal breath sounds.  Lymphadenopathy:     Cervical: Cervical adenopathy present.  Neurological:     Mental Status: She is oriented to person, place, and time.     Lab Results  Component Value Date   WBC 6.6 03/11/2016   HGB 13.9 03/11/2016   HCT 42.5 03/11/2016   PLT 333.0 03/11/2016   GLUCOSE 89 08/24/2015   CHOL 277 (H) 08/24/2015   TRIG 141.0 08/24/2015   HDL 84.20 08/24/2015   LDLCALC 164 (H) 08/24/2015   ALT 28 08/24/2015   AST 25 08/24/2015   NA 138 08/24/2015   K 4.1 08/24/2015   CL 101 08/24/2015   CREATININE 0.73 08/24/2015   BUN 11 08/24/2015   CO2 28 08/24/2015    Assessment & Plan:   Shirley Wilson was seen today for cough.  Diagnoses and all orders for this visit:  Acute bronchitis, unspecified organism -     benzonatate (TESSALON) 100 MG capsule; Take 1-2 capsules (100-200 mg total) by mouth 3 (three) times daily as needed for cough. -     albuterol (PROVENTIL HFA;VENTOLIN HFA) 108 (90 Base) MCG/ACT inhaler; Inhale 1-2 puffs into the lungs every 6 (six) hours as needed. -     Discontinue: amoxicillin-clavulanate (AUGMENTIN) 875-125 MG tablet; Take 1 tablet by mouth 2 (two) times daily. -     predniSONE (DELTASONE) 20 MG tablet; Take 2 tablets (40 mg total) by mouth daily with breakfast. -     azithromycin (ZITHROMAX Z-PAK) 250 MG tablet; Take 1 tablet (250 mg total) by mouth daily. Take 2tabs on first day, then 1tab once a day till complete   I have discontinued Shirley Wilson's  amoxicillin-clavulanate. I am also having her start on benzonatate, albuterol, predniSONE, and azithromycin. Additionally, I am having her maintain her estradiol and progesterone.  Meds ordered this encounter  Medications  . benzonatate (TESSALON) 100 MG capsule    Sig: Take 1-2 capsules (100-200 mg total) by mouth 3 (three) times daily as needed for cough.    Dispense:  20 capsule    Refill:  0    Order Specific Question:   Supervising Provider    Answer:   Dianne Dun [[id number]]  . albuterol (PROVENTIL HFA;VENTOLIN HFA) 108 (90 Base) MCG/ACT inhaler    Sig: Inhale 1-2 puffs into the lungs every 6 (six) hours as needed.    Dispense:  1 Inhaler    Refill:  0    Order Specific Question:   Supervising Provider    Answer:   Dianne Dun [[id number]]  . DISCONTD: amoxicillin-clavulanate (AUGMENTIN) 875-125 MG tablet    Sig: Take 1 tablet by mouth 2 (two) times daily.    Dispense:  14 tablet    Refill:  0    Order Specific Question:   Supervising Provider    Answer:   Dianne Dun [[id number]]  . predniSONE (DELTASONE) 20 MG tablet    Sig: Take 2 tablets (40 mg total) by mouth daily with breakfast.    Dispense:  6 tablet    Refill:  0    Order Specific Question:   Supervising Provider    Answer:   Dianne Dun [[id number]]  . azithromycin (ZITHROMAX Z-PAK) 250 MG tablet    Sig: Take 1 tablet (250 mg total) by mouth daily. Take 2tabs on first day, then 1tab once a day till complete    Dispense:  6 tablet    Refill:  0    Order Specific Question:   Supervising Provider    Answer:   Dianne Dun [[id number]]    Problem List Items Addressed This Visit    None    Visit Diagnoses    Acute bronchitis, unspecified organism    -  Primary   Relevant Medications   benzonatate (TESSALON) 100 MG capsule   albuterol (PROVENTIL HFA;VENTOLIN HFA) 108 (90 Base) MCG/ACT inhaler   predniSONE (DELTASONE) 20 MG tablet   azithromycin (ZITHROMAX Z-PAK) 250 MG tablet       Follow-up: No follow-ups on  file.  Alysia Penna, NP

## 2018-11-10 ENCOUNTER — Encounter: Payer: Self-pay | Admitting: Nurse Practitioner

## 2018-11-10 MED ORDER — AZITHROMYCIN 250 MG PO TABS: 250.0000 mg | ORAL_TABLET | Freq: Every day | ORAL | 0 refills | Status: DC

## 2018-11-10 NOTE — Addendum Note (Signed)
Addended by: Alysia Penna L on: 11/10/2018 08:16 AM   Modules accepted: Orders

## 2018-11-19 ENCOUNTER — Encounter: Payer: Self-pay | Admitting: Nurse Practitioner

## 2018-11-20 ENCOUNTER — Telehealth: Payer: BC Managed Care – PPO | Admitting: Family

## 2018-11-20 ENCOUNTER — Encounter: Payer: Self-pay | Admitting: Nurse Practitioner

## 2018-11-20 DIAGNOSIS — R6889 Other general symptoms and signs: Secondary | ICD-10-CM

## 2018-11-20 DIAGNOSIS — R0602 Shortness of breath: Secondary | ICD-10-CM

## 2018-11-20 NOTE — Progress Notes (Signed)
Based on what you shared with me it looks like you have a serious condition that should be evaluated in a face to face office visit.  NOTE: If you entered your credit card information for this eVisit, you will not be charged. You may see a "hold" on your card for the $30 but that hold will drop off and you will not have a charge processed.  Given your symptoms, how long it has lasted, and you have already been treated with antibiotics, I believe it would be best if you were evaluated face to face.   If you are having a true medical emergency please call 911.  If you need an urgent face to face visit, [facility] has four urgent care centers for your convenience.  If you need care fast and have a high deductible or no insurance consider:   [email or web address] to reserve your spot online an avoid wait times  [facility] [address] [place], [place] [address] 8 am to 8 pm Monday-Friday 10 am to 4 pm Saturday-Sunday *Across the street from [employer]  [address] [place], [address] 8 am to 5 pm Monday-Friday * In the [facility] on the [facility]   The following sites will take your  insurance:  . [facility]  [phone] Get Driving Directions Find a Provider at this Location  [address] [place], [place] [address] . 10 am to 8 pm Monday-Friday . 12 pm to 8 pm Saturday-Sunday   . [facility] at [facility]  [phone] Get Driving Directions Find a Provider at this Location  [address] [place], [place] [address] . 8 am to 8 pm Monday-Friday . 9 am to 6 pm Saturday . 11 am to 6 pm Sunday   . [facility] at [facility]  [phone] Get Driving Directions  [address] [place], [place] [address] . 8 am to 8 pm Monday-Friday . 8 am to 4 pm Saturday-Sunday   Your e-visit answers were reviewed by a  board certified advanced clinical practitioner to complete your personal care plan.  Thank you for using e-Visits.

## 2018-11-22 ENCOUNTER — Encounter: Payer: Self-pay | Admitting: Family Medicine

## 2018-11-22 ENCOUNTER — Ambulatory Visit: Payer: BC Managed Care – PPO | Admitting: Family Medicine

## 2018-11-22 VITALS — BP 150/88 | HR 68 | Temp 97.9°F | Ht 67.0 in | Wt 138.4 lb

## 2018-11-22 DIAGNOSIS — J019 Acute sinusitis, unspecified: Secondary | ICD-10-CM

## 2018-11-22 MED ORDER — CEFDINIR 300 MG PO CAPS: 300.0000 mg | ORAL_CAPSULE | Freq: Two times a day (BID) | ORAL | 0 refills | Status: AC

## 2018-11-22 NOTE — Patient Instructions (Signed)
Drink plenty of fluids, especially water Use nasal saline spray at least 3 times per day Use humidifier at night Try Mucinex 1 tab twice per day Try flonase 2 sprays each nostril daily Take antibiotic as directed twice per day for 10 days Follow-up if symptoms worsen or do not improve in 7-10 days

## 2018-11-22 NOTE — Progress Notes (Signed)
Shirley Wilson is a 63 y.o. female  Chief Complaint  Patient presents with  . Migraine    fatigue, SOB, sinus pressure,Not better from last appt(11/09/18)./finished prednisone,azithromycin,  and cough meds    HPI: Shirley Wilson is a 63 y.o. female who was seen by Alysia Penna, NP on 11/09/18 and diagnosed with acute bronchitis. She was Rx'd augmentin, prednisone, tessalon capsules. Pt has GI reaction (nausea, vomiting) to augmentin so abx was switched to azithromycin on 11/10/18. Pts symptoms improved but never fully resolved. In the past 5-7 days, pt complains of increased fatigue, sinus pressure and nasal congestion alternating with runny nose and PND, and sore throat. + sweats and chills. Mild, non-productive cough. Headache. She attempted to do an e-visit on 1/18 but was told she needed to be seen in person.  She has been taking 1/3 of a tab of augmentin for total of 2 tabs daily x 1 day and then 2 1/3 tabs today.   Past Medical History:  Diagnosis Date  . Allergy   . Heart murmur    MVP  . Migraine     Past Surgical History:  Procedure Laterality Date  . FACIAL COSMETIC SURGERY  2015  . FOOT FRACTURE SURGERY Right     Social History   Socioeconomic History  . Marital status: Married    Spouse name: Not on file  . Number of children: Not on file  . Years of education: Not on file  . Highest education level: Not on file  Occupational History  . Not on file  Social Needs  . Financial resource strain: Not on file  . Food insecurity:    Worry: Not on file    Inability: Not on file  . Transportation needs:    Medical: Not on file    Non-medical: Not on file  Tobacco Use  . Smoking status: Former Games developer  . Smokeless tobacco: Never Used  Substance and Sexual Activity  . Alcohol use: Yes    Alcohol/week: 8.0 standard drinks    Types: 8 Standard drinks or equivalent per week    Comment: weekly  . Drug use: No  . Sexual activity: Not on file  Lifestyle  . Physical  activity:    Days per week: Not on file    Minutes per session: Not on file  . Stress: Not on file  Relationships  . Social connections:    Talks on phone: Not on file    Gets together: Not on file    Attends religious service: Not on file    Active member of club or organization: Not on file    Attends meetings of clubs or organizations: Not on file    Relationship status: Not on file  . Intimate partner violence:    Fear of current or ex partner: Not on file    Emotionally abused: Not on file    Physically abused: Not on file    Forced sexual activity: Not on file  Other Topics Concern  . Not on file  Social History Narrative  . Not on file    Family History  Problem Relation Age of Onset  . Colon cancer Neg Hx   . Esophageal cancer Neg Hx   . Rectal cancer Neg Hx   . Stomach cancer Neg Hx      Immunization History  Administered Date(s) Administered  . Influenza,inj,Quad PF,6+ Mos 09/29/2018  . Tdap 01/16/2011  . Zoster Recombinat (Shingrix) 10/21/2018    Outpatient Encounter Medications  as of 11/22/2018  Medication Sig  . albuterol (PROVENTIL HFA;VENTOLIN HFA) 108 (90 Base) MCG/ACT inhaler Inhale 1-2 puffs into the lungs every 6 (six) hours as needed.  Marland Kitchen. estradiol (ESTRACE) 0.5 MG tablet Take 1 tablet (0.5 mg total) by mouth daily. (Patient taking differently: Take 0.5 mg by mouth daily. )  . progesterone (PROMETRIUM) 200 MG capsule Take 12 days per month  . azithromycin (ZITHROMAX Z-PAK) 250 MG tablet Take 1 tablet (250 mg total) by mouth daily. Take 2tabs on first day, then 1tab once a day till complete (Patient not taking: Reported on 11/22/2018)  . benzonatate (TESSALON) 100 MG capsule Take 1-2 capsules (100-200 mg total) by mouth 3 (three) times daily as needed for cough. (Patient not taking: Reported on 11/22/2018)  . cefdinir (OMNICEF) 300 MG capsule Take 1 capsule (300 mg total) by mouth 2 (two) times daily for 10 days.  . predniSONE (DELTASONE) 20 MG tablet Take  2 tablets (40 mg total) by mouth daily with breakfast. (Patient not taking: Reported on 11/22/2018)   No facility-administered encounter medications on file as of 11/22/2018.      ROS: Pertinent positives and negatives noted in HPI. Remainder of ROS non-contributory  Allergies  Allergen Reactions  . Sulfa Antibiotics     Rash all over    BP (!) 150/88   Pulse 68   Temp 97.9 F (36.6 C) (Oral)   Ht 5\' 7"  (1.702 m)   Wt 138 lb 6.4 oz (62.8 kg)   LMP  (LMP Unknown)   SpO2 97%   BMI 21.68 kg/m   Physical Exam  Constitutional: She is oriented to person, place, and time. She appears well-developed and well-nourished. No distress.  HENT:  Head: Normocephalic and atraumatic.  Right Ear: Tympanic membrane and ear canal normal.  Left Ear: Tympanic membrane and ear canal normal.  Nose: Mucosal edema and rhinorrhea present. Right sinus exhibits maxillary sinus tenderness. Right sinus exhibits no frontal sinus tenderness. Left sinus exhibits maxillary sinus tenderness. Left sinus exhibits no frontal sinus tenderness.  Mouth/Throat: Mucous membranes are normal. Posterior oropharyngeal erythema present. No oropharyngeal exudate or posterior oropharyngeal edema.  Eyes: Pupils are equal, round, and reactive to light. Conjunctivae and EOM are normal. Right eye exhibits no discharge. Left eye exhibits no discharge.  Neck: Neck supple.  Cardiovascular: Normal rate, regular rhythm and normal heart sounds.  Pulmonary/Chest: Effort normal and breath sounds normal. No respiratory distress. She has no wheezes. She has no rhonchi.  Lymphadenopathy:    She has no cervical adenopathy.  Neurological: She is alert and oriented to person, place, and time.     A/P:  1. Acute non-recurrent sinusitis, unspecified location - cont supportive care to include increased fluids, rest, tylenol or ibuprofen PRN - add nasal saline spray and flonase, can also add mucinex BID - stop augmentin Rx: - cefdinir  (OMNICEF) 300 MG capsule; Take 1 capsule (300 mg total) by mouth 2 (two) times daily for 10 days.  Dispense: 20 capsule; Refill: 0 - f/u if symptoms worsen or do not improve in 7-10 days Discussed plan and reviewed medications with patient, including risks, benefits, and potential side effects. Pt expressed understand. All questions answered.

## 2019-05-26 ENCOUNTER — Encounter: Payer: Self-pay | Admitting: Family Medicine

## 2019-05-26 ENCOUNTER — Telehealth: Payer: BC Managed Care – PPO | Admitting: Family Medicine

## 2019-05-26 NOTE — Telephone Encounter (Signed)
Yes please set up VV for this afternoon or tomorrow

## 2019-05-27 ENCOUNTER — Ambulatory Visit (INDEPENDENT_AMBULATORY_CARE_PROVIDER_SITE_OTHER): Payer: BC Managed Care – PPO | Admitting: Family

## 2019-05-27 ENCOUNTER — Encounter: Payer: Self-pay | Admitting: Family

## 2019-05-27 ENCOUNTER — Encounter: Payer: Self-pay | Admitting: Internal Medicine

## 2019-05-27 DIAGNOSIS — F419 Anxiety disorder, unspecified: Secondary | ICD-10-CM

## 2019-05-27 DIAGNOSIS — F329 Major depressive disorder, single episode, unspecified: Secondary | ICD-10-CM

## 2019-05-27 MED ORDER — SERTRALINE HCL 50 MG PO TABS: 50.0000 mg | ORAL_TABLET | Freq: Every day | ORAL | 1 refills | Status: DC

## 2019-05-27 MED ORDER — ALPRAZOLAM 0.5 MG PO TABS: 0.5000 mg | ORAL_TABLET | Freq: Every evening | ORAL | 0 refills | Status: DC | PRN

## 2019-05-27 NOTE — Progress Notes (Signed)
Shirley Wilson is a 63 y.o. female with the following history as recorded in EpicCare:  Patient Active Problem List   Diagnosis Date Noted  . Hot flashes 01/15/2016  . Routine general medical examination at a health care facility 08/19/2015    Current Outpatient Medications  Medication Sig Dispense Refill  . estradiol (ESTRACE) 0.5 MG tablet Take 1 tablet (0.5 mg total) by mouth daily. (Patient taking differently: Take 0.5 mg by mouth daily. ) 30 tablet 11  . progesterone (PROMETRIUM) 200 MG capsule Take 12 days per month 12 capsule 11  . albuterol (PROVENTIL HFA;VENTOLIN HFA) 108 (90 Base) MCG/ACT inhaler Inhale 1-2 puffs into the lungs every 6 (six) hours as needed. (Patient not taking: Reported on 05/27/2019) 1 Inhaler 0  . ALPRAZolam (XANAX) 0.5 MG tablet Take 1 tablet (0.5 mg total) by mouth at bedtime as needed for anxiety. 15 tablet 0  . sertraline (ZOLOFT) 50 MG tablet Take 1 tablet (50 mg total) by mouth daily. 30 tablet 1   No current facility-administered medications for this visit.     Allergies: Sulfa antibiotics  Past Medical History:  Diagnosis Date  . Allergy   . Heart murmur    MVP  . Migraine     Past Surgical History:  Procedure Laterality Date  . FACIAL COSMETIC SURGERY  2015  . FOOT FRACTURE SURGERY Right     Family History  Problem Relation Age of Onset  . Colon cancer Neg Hx   . Esophageal cancer Neg Hx   . Rectal cancer Neg Hx   . Stomach cancer Neg Hx     Social History   Tobacco Use  . Smoking status: Former Research scientist (life sciences)  . Smokeless tobacco: Never Used  Substance Use Topics  . Alcohol use: Yes    Alcohol/week: 8.0 standard drinks    Types: 8 Standard drinks or equivalent per week    Comment: weekly    Subjective:    I connected with Shirley Wilson on 05/27/19 at  3:40 PM EDT by a video enabled telemedicine application and verified that I am speaking with the correct person using two identifiers. Patient and I are the only 2 people on the video call.     I discussed the limitations of evaluation and management by telemedicine and the availability of in person appointments. The patient expressed understanding and agreed to proceed.  Increased problems with anxiety/ depression secondary to pandemic; notes she has had problems with anxiety in the past- has used Zoloft with success; decreased appetite/ not sleeping well; admits that work stress has been extremely high recently due to pandemic complications affecting her job; is open to working with a therapist and will be planning to start;   Notes that she is also working with [facility] and is up to date with labs; last saw them earlier this year. Will get those results forwarded to Dr. Sharlet Salina for review.      Objective:  There were no vitals filed for this visit.  General: Well developed, well nourished, in no acute distress  Head: Normocephalic and atraumatic  Lungs: Respirations unlabored; Neurologic: Alert and oriented; speech intact; face symmetrical;   Assessment:  1. Anxiety and depression     Plan:  Re-start Zoloft which patient has tolerated in the past; due to severity of her symptoms and previous response, will go ahead and just start 50 mg daily; short term use of Xanax to help her sleep for the next 2 weeks; plan to follow-up  with her PCP in 2 weeks- patient prefers virtual visit; she will have her labs from her [facility] provider forwarded.  No follow-ups on file.  No orders of the defined types were placed in this encounter.   Requested Prescriptions   Signed Prescriptions Disp Refills  . sertraline (ZOLOFT) 50 MG tablet 30 tablet 1    Sig: Take 1 tablet (50 mg total) by mouth daily.  Marland Kitchen. ALPRAZolam (XANAX) 0.5 MG tablet 15 tablet 0    Sig: Take 1 tablet (0.5 mg total) by mouth at bedtime as needed for anxiety.

## 2019-06-09 ENCOUNTER — Encounter: Payer: Self-pay | Admitting: Internal Medicine

## 2019-06-09 ENCOUNTER — Ambulatory Visit (INDEPENDENT_AMBULATORY_CARE_PROVIDER_SITE_OTHER): Payer: BC Managed Care – PPO | Admitting: Internal Medicine

## 2019-06-09 DIAGNOSIS — F4323 Adjustment disorder with mixed anxiety and depressed mood: Secondary | ICD-10-CM | POA: Diagnosis not present

## 2019-06-09 DIAGNOSIS — F4320 Adjustment disorder, unspecified: Secondary | ICD-10-CM | POA: Insufficient documentation

## 2019-06-09 MED ORDER — SERTRALINE HCL 50 MG PO TABS: 50.0000 mg | ORAL_TABLET | Freq: Every day | ORAL | 2 refills | Status: AC

## 2019-06-09 MED ORDER — ALPRAZOLAM 0.5 MG PO TABS: 0.5000 mg | ORAL_TABLET | Freq: Every evening | ORAL | 3 refills | Status: AC | PRN

## 2019-06-09 NOTE — Progress Notes (Signed)
Virtual Visit via Video Note  I connected with Shirley Wilson on 06/09/19 at 11:20 AM EDT by a video enabled telemedicine application and verified that I am speaking with the correct person using two identifiers.  The patient and the provider were at separate locations throughout the entire encounter.   I discussed the limitations of evaluation and management by telemedicine and the availability of in person appointments. The patient expressed understanding and agreed to proceed.  History of Present Illness: The patient is a 63 y.o. female with visit for follow up of adjustment disorder. She was having a lot of emotional problems from the pandemic. She was seen about 2 weeks ago and started on zoloft with prn alprazolam at night time. She has been taking this and 1/2 aprazolam at night time to help her sleep. She is doing much better at this time. Not 100% better but significant improvement. She is having mild dry mouth this this. Not enough to want change. She denies SI/HI. Overall it is improving.   Observations/Objective: Appearance: normal, breathing appears normal, casual grooming, abdomen does not appear distended, throat not visualized as mask in place, memory normal, mental status is A and O times 3  Assessment and Plan: See problem oriented charting  Follow Up Instructions: refill alprazolam and zoloft, would recommend keeping zoloft while pandemic is present  I discussed the assessment and treatment plan with the patient. The patient was provided an opportunity to ask questions and all were answered. The patient agreed with the plan and demonstrated an understanding of the instructions.   The patient was advised to call back or seek an in-person evaluation if the symptoms worsen or if the condition fails to improve as anticipated.  Hoyt Koch, MD

## 2019-06-09 NOTE — Assessment & Plan Note (Signed)
Taking zoloft and uses xanax for sleep. Refilled both today and would encourage her to take zoloft throughout pandemic.

## 2019-08-18 ENCOUNTER — Encounter: Payer: Self-pay | Admitting: Internal Medicine

## 2019-08-18 MED ORDER — NYSTATIN-TRIAMCINOLONE 100000-0.1 UNIT/GM-% EX OINT: 1.0000 "application " | TOPICAL_OINTMENT | Freq: Two times a day (BID) | CUTANEOUS | 0 refills | Status: AC

## 2019-09-26 ENCOUNTER — Other Ambulatory Visit: Payer: Self-pay

## 2019-09-26 DIAGNOSIS — Z20822 Contact with and (suspected) exposure to covid-19: Secondary | ICD-10-CM

## 2019-09-27 LAB — NOVEL CORONAVIRUS, NAA: SARS-CoV-2, NAA: NOT DETECTED

## 2019-09-29 ENCOUNTER — Encounter: Payer: Self-pay | Admitting: Internal Medicine

## 2019-10-15 ENCOUNTER — Encounter: Payer: Self-pay | Admitting: Internal Medicine

## 2019-11-07 ENCOUNTER — Telehealth: Payer: BC Managed Care – PPO | Admitting: Physician Assistant

## 2019-11-07 DIAGNOSIS — J329 Chronic sinusitis, unspecified: Secondary | ICD-10-CM | POA: Diagnosis not present

## 2019-11-07 MED ORDER — PREDNISONE 20 MG PO TABS: ORAL_TABLET | ORAL | 0 refills | Status: AC

## 2019-11-07 MED ORDER — DOXYCYCLINE HYCLATE 100 MG PO TABS: 100.0000 mg | ORAL_TABLET | Freq: Two times a day (BID) | ORAL | 0 refills | Status: AC

## 2019-11-07 NOTE — Progress Notes (Signed)
We are sorry that you are not feeling well.  Here is how we plan to help!  Based on what you have shared with me it looks like you have sinusitis.  Sinusitis is inflammation and infection in the sinus cavities of the head.  Based on your presentation I believe you most likely have Acute Bacterial Sinusitis.  This is an infection caused by bacteria and is treated with antibiotics. I have prescribed Doxycycline 100mg by mouth twice a day for 10 days. You may use an oral decongestant such as Mucinex D or if you have glaucoma or high blood pressure use plain Mucinex. Saline nasal spray help and can safely be used as often as needed for congestion.  If you develop worsening sinus pain, fever or notice severe headache and vision changes, or if symptoms are not better after completion of antibiotic, please schedule an appointment with a health care provider.    Sinus infections are not as easily transmitted as other respiratory infection, however we still recommend that you avoid close contact with loved ones, especially the very young and elderly.  Remember to wash your hands thoroughly throughout the day as this is the number one way to prevent the spread of infection!  Home Care:  Only take medications as instructed by your medical team.  Complete the entire course of an antibiotic.  Do not take these medications with alcohol.  A steam or ultrasonic humidifier can help congestion.  You can place a towel over your head and breathe in the steam from hot water coming from a faucet.  Avoid close contacts especially the very young and the elderly.  Cover your mouth when you cough or sneeze.  Always remember to wash your hands.  Get Help Right Away If:  You develop worsening fever or sinus pain.  You develop a severe head ache or visual changes.  Your symptoms persist after you have completed your treatment plan.  Make sure you  Understand these instructions.  Will watch your  condition.  Will get help right away if you are not doing well or get worse.  Your e-visit answers were reviewed by a board certified advanced clinical practitioner to complete your personal care plan.  Depending on the condition, your plan could have included both over the counter or prescription medications.  If there is a problem please reply  once you have received a response from your provider.  Your safety is important to us.  If you have drug allergies check your prescription carefully.    You can use MyChart to ask questions about today's visit, request a non-urgent call back, or ask for a work or school excuse for 24 hours related to this e-Visit. If it has been greater than 24 hours you will need to follow up with your provider, or enter a new e-Visit to address those concerns.  You will get an e-mail in the next two days asking about your experience.  I hope that your e-visit has been valuable and will speed your recovery. Thank you for using e-visits.     Greater than 5 minutes, yet less than 10 minutes of time have been spent researching, coordinating and implementing care for this patient today.    

## 2019-11-07 NOTE — Addendum Note (Signed)
Addended by: Sebastian Ache on: 11/07/2019 01:42 PM   Modules accepted: Orders
